# Patient Record
Sex: Male | Born: 1965
Health system: Southern US, Community
[De-identification: ages and names within clinical notes are randomized; demographics above are authoritative.]

## PROBLEM LIST (undated history)

## (undated) DIAGNOSIS — Z803 Family history of malignant neoplasm of breast: Secondary | ICD-10-CM

## (undated) DIAGNOSIS — E66811 Obesity, class 1: Secondary | ICD-10-CM

## (undated) DIAGNOSIS — Z9889 Other specified postprocedural states: Secondary | ICD-10-CM

## (undated) DIAGNOSIS — S8992XA Unspecified injury of left lower leg, initial encounter: Secondary | ICD-10-CM

## (undated) DIAGNOSIS — G629 Polyneuropathy, unspecified: Secondary | ICD-10-CM

## (undated) DIAGNOSIS — K219 Gastro-esophageal reflux disease without esophagitis: Secondary | ICD-10-CM

## (undated) DIAGNOSIS — E785 Hyperlipidemia, unspecified: Secondary | ICD-10-CM

## (undated) DIAGNOSIS — C61 Malignant neoplasm of prostate: Secondary | ICD-10-CM

## (undated) DIAGNOSIS — A048 Other specified bacterial intestinal infections: Secondary | ICD-10-CM

## (undated) DIAGNOSIS — Z8042 Family history of malignant neoplasm of prostate: Secondary | ICD-10-CM

## (undated) DIAGNOSIS — Z8639 Personal history of other endocrine, nutritional and metabolic disease: Secondary | ICD-10-CM

## (undated) DIAGNOSIS — S069XAA Unspecified intracranial injury with loss of consciousness status unknown, initial encounter: Secondary | ICD-10-CM

## (undated) DIAGNOSIS — Z8669 Personal history of other diseases of the nervous system and sense organs: Secondary | ICD-10-CM

## (undated) DIAGNOSIS — Z1211 Encounter for screening for malignant neoplasm of colon: Secondary | ICD-10-CM

## (undated) DIAGNOSIS — R7303 Prediabetes: Secondary | ICD-10-CM

## (undated) DIAGNOSIS — E8881 Metabolic syndrome: Secondary | ICD-10-CM

## (undated) DIAGNOSIS — E669 Obesity, unspecified: Secondary | ICD-10-CM

## (undated) DIAGNOSIS — R0600 Dyspnea, unspecified: Secondary | ICD-10-CM

## (undated) DIAGNOSIS — R202 Paresthesia of skin: Secondary | ICD-10-CM

## (undated) DIAGNOSIS — R0789 Other chest pain: Secondary | ICD-10-CM

## (undated) DIAGNOSIS — M47816 Spondylosis without myelopathy or radiculopathy, lumbar region: Secondary | ICD-10-CM

## (undated) DIAGNOSIS — Z809 Family history of malignant neoplasm, unspecified: Secondary | ICD-10-CM

## (undated) DIAGNOSIS — G43909 Migraine, unspecified, not intractable, without status migrainosus: Secondary | ICD-10-CM

## (undated) DIAGNOSIS — S069X9A Unspecified intracranial injury with loss of consciousness of unspecified duration, initial encounter: Secondary | ICD-10-CM

## (undated) DIAGNOSIS — G4733 Obstructive sleep apnea (adult) (pediatric): Secondary | ICD-10-CM

## (undated) HISTORY — DX: Personal history of other endocrine, nutritional and metabolic disease: Z86.39

## (undated) HISTORY — DX: Obesity, unspecified: E66.9

## (undated) HISTORY — DX: Malignant neoplasm of prostate: C61

## (undated) HISTORY — DX: Family history of malignant neoplasm, unspecified: Z80.9

## (undated) HISTORY — DX: Unspecified intracranial injury with loss of consciousness status unknown, initial encounter: S06.9XAA

## (undated) HISTORY — DX: Other specified bacterial intestinal infections: A04.8

## (undated) HISTORY — DX: Hyperlipidemia, unspecified: E78.5

## (undated) HISTORY — DX: Polyneuropathy, unspecified: G62.9

## (undated) HISTORY — DX: Unspecified injury of left lower leg, initial encounter: S89.92XA

## (undated) HISTORY — DX: Family history of malignant neoplasm of breast: Z80.3

## (undated) HISTORY — DX: Spondylosis without myelopathy or radiculopathy, lumbar region: M47.816

## (undated) HISTORY — DX: Unspecified intracranial injury with loss of consciousness of unspecified duration, initial encounter: S06.9X9A

## (undated) HISTORY — DX: Migraine, unspecified, not intractable, without status migrainosus: G43.909

## (undated) HISTORY — DX: Metabolic syndrome: E88.81

## (undated) HISTORY — DX: Dyspnea, unspecified: R06.00

## (undated) HISTORY — DX: Family history of malignant neoplasm of prostate: Z80.42

## (undated) HISTORY — DX: Other specified postprocedural states: Z98.890

## (undated) HISTORY — DX: Obesity, class 1: E66.811

## (undated) HISTORY — DX: Obstructive sleep apnea (adult) (pediatric): G47.33

## (undated) HISTORY — DX: Other chest pain: R07.89

## (undated) HISTORY — DX: Personal history of other diseases of the nervous system and sense organs: Z86.69

## (undated) HISTORY — DX: Prediabetes: R73.03

## (undated) HISTORY — DX: Encounter for screening for malignant neoplasm of colon: Z12.11

## (undated) HISTORY — DX: Paresthesia of skin: R20.2

---

## 1995-11-06 HISTORY — PX: APPENDECTOMY: SHX54

## 2002-08-10 ENCOUNTER — Inpatient Hospital Stay (HOSPITAL_COMMUNITY): Admission: EM | Admit: 2002-08-10 | Discharge: 2002-08-11 | Payer: Self-pay | Admitting: Emergency Medicine

## 2002-08-10 ENCOUNTER — Encounter: Payer: Self-pay | Admitting: Emergency Medicine

## 2003-10-19 ENCOUNTER — Encounter: Admission: RE | Admit: 2003-10-19 | Discharge: 2003-10-19 | Payer: Self-pay | Admitting: Family Medicine

## 2006-11-05 HISTORY — PX: BICEPS TENDON REPAIR: SHX566

## 2009-11-05 DIAGNOSIS — E8881 Metabolic syndrome: Secondary | ICD-10-CM

## 2009-11-05 DIAGNOSIS — Z8639 Personal history of other endocrine, nutritional and metabolic disease: Secondary | ICD-10-CM

## 2009-11-05 HISTORY — DX: Metabolic syndrome: E88.81

## 2009-11-05 HISTORY — DX: Personal history of other endocrine, nutritional and metabolic disease: Z86.39

## 2009-11-05 HISTORY — DX: Metabolic syndrome: E88.810

## 2010-05-05 DIAGNOSIS — R0789 Other chest pain: Secondary | ICD-10-CM

## 2010-05-05 HISTORY — DX: Other chest pain: R07.89

## 2010-11-05 DIAGNOSIS — S8992XA Unspecified injury of left lower leg, initial encounter: Secondary | ICD-10-CM

## 2010-11-05 HISTORY — DX: Unspecified injury of left lower leg, initial encounter: S89.92XA

## 2011-03-06 DIAGNOSIS — Z8669 Personal history of other diseases of the nervous system and sense organs: Secondary | ICD-10-CM

## 2011-03-06 HISTORY — DX: Personal history of other diseases of the nervous system and sense organs: Z86.69

## 2011-11-06 DIAGNOSIS — R0600 Dyspnea, unspecified: Secondary | ICD-10-CM

## 2011-11-06 HISTORY — DX: Dyspnea, unspecified: R06.00

## 2011-11-26 DIAGNOSIS — E785 Hyperlipidemia, unspecified: Secondary | ICD-10-CM | POA: Insufficient documentation

## 2011-11-30 ENCOUNTER — Encounter (INDEPENDENT_AMBULATORY_CARE_PROVIDER_SITE_OTHER): Payer: Federal, State, Local not specified - PPO | Admitting: *Deleted

## 2011-11-30 ENCOUNTER — Encounter: Payer: Self-pay | Admitting: Internal Medicine

## 2011-11-30 ENCOUNTER — Ambulatory Visit (INDEPENDENT_AMBULATORY_CARE_PROVIDER_SITE_OTHER): Payer: Federal, State, Local not specified - PPO | Admitting: Internal Medicine

## 2011-11-30 ENCOUNTER — Telehealth: Payer: Self-pay | Admitting: *Deleted

## 2011-11-30 ENCOUNTER — Ambulatory Visit (INDEPENDENT_AMBULATORY_CARE_PROVIDER_SITE_OTHER)
Admission: RE | Admit: 2011-11-30 | Discharge: 2011-11-30 | Disposition: A | Discharge status: Home / Self Care | Payer: Federal, State, Local not specified - PPO | Source: Ambulatory Visit | Attending: Internal Medicine | Admitting: Internal Medicine

## 2011-11-30 VITALS — BP 122/88 | HR 81 | Temp 98.1°F | Ht 74.5 in | Wt 239.6 lb

## 2011-11-30 DIAGNOSIS — R0609 Other forms of dyspnea: Secondary | ICD-10-CM

## 2011-11-30 DIAGNOSIS — R0602 Shortness of breath: Secondary | ICD-10-CM

## 2011-11-30 DIAGNOSIS — R0600 Dyspnea, unspecified: Secondary | ICD-10-CM

## 2011-11-30 DIAGNOSIS — M79609 Pain in unspecified limb: Secondary | ICD-10-CM

## 2011-11-30 DIAGNOSIS — I2699 Other pulmonary embolism without acute cor pulmonale: Secondary | ICD-10-CM

## 2011-11-30 DIAGNOSIS — M79606 Pain in leg, unspecified: Secondary | ICD-10-CM

## 2011-11-30 DIAGNOSIS — R0989 Other specified symptoms and signs involving the circulatory and respiratory systems: Secondary | ICD-10-CM

## 2011-11-30 MED ORDER — IOHEXOL 300 MG/ML  SOLN
80.0000 mL | Freq: Once | INTRAMUSCULAR | Status: AC | PRN
  Administered 2011-11-30: 180 mL via INTRAVENOUS
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
## 2011-11-30 NOTE — Telephone Encounter (Signed)
Received call from [facility] at [facility] stating pt had CT chest done which indicated no PE.  Showed results to MR who stated ok to inform pt no PE but that we will be calling him to set up further testing.  Rose verbalized understanding and stated she would relay message to pt who will be waiting a call from our office regarding appt date/times for further test.  Per MR:  Order PFTS ASAP at either [facility] or [facility]  Also order: venous duplex leg bilat to r/o DVT.  Pt will need an appt with MR next week, approx Wednesday?.    Orders are in the computer.  Will forward message on to Victorino Dike so she can schedule pt a f/u appt  next week as MR's schedule is full.

## 2011-11-30 NOTE — Patient Instructions (Signed)
Have ct angio chest today to rule out pulmonary embolism  - result to be paged to DR [facility] today If that is fine, then we will do pft breathing test

## 2011-12-02 DIAGNOSIS — R0600 Dyspnea, unspecified: Secondary | ICD-10-CM | POA: Insufficient documentation
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
## 2011-12-03 ENCOUNTER — Ambulatory Visit (HOSPITAL_COMMUNITY)
Admission: RE | Admit: 2011-12-03 | Discharge: 2011-12-03 | Disposition: A | Discharge status: Home / Self Care | Payer: Federal, State, Local not specified - PPO | Source: Ambulatory Visit | Attending: Internal Medicine | Admitting: Internal Medicine

## 2011-12-03 DIAGNOSIS — R0989 Other specified symptoms and signs involving the circulatory and respiratory systems: Secondary | ICD-10-CM | POA: Insufficient documentation

## 2011-12-03 DIAGNOSIS — R0609 Other forms of dyspnea: Secondary | ICD-10-CM | POA: Insufficient documentation

## 2011-12-03 DIAGNOSIS — R0600 Dyspnea, unspecified: Secondary | ICD-10-CM

## 2011-12-03 LAB — PULMONARY FUNCTION TEST

## 2011-12-03 NOTE — Telephone Encounter (Signed)
PER MR advise the pt that duplex was negative and set f/u. Pt aware and appt set for 12-06-11. Carron Curie, CMA

## 2011-12-04 ENCOUNTER — Telehealth: Payer: Self-pay | Admitting: Internal Medicine

## 2011-12-04 NOTE — Telephone Encounter (Signed)
I spoke with [name] and she stated she did not try to call Jacob James. I spoke with Jacob James and made him aware of this and nothing further was needed

## 2011-12-06 ENCOUNTER — Encounter: Payer: Self-pay | Admitting: Internal Medicine

## 2011-12-06 ENCOUNTER — Ambulatory Visit (INDEPENDENT_AMBULATORY_CARE_PROVIDER_SITE_OTHER): Payer: Federal, State, Local not specified - PPO | Admitting: Internal Medicine

## 2011-12-06 VITALS — BP 112/76 | HR 71 | Temp 98.0°F | Ht 74.5 in | Wt 239.6 lb

## 2011-12-06 DIAGNOSIS — R0609 Other forms of dyspnea: Secondary | ICD-10-CM

## 2011-12-06 DIAGNOSIS — R0602 Shortness of breath: Secondary | ICD-10-CM

## 2011-12-06 DIAGNOSIS — R0989 Other specified symptoms and signs involving the circulatory and respiratory systems: Secondary | ICD-10-CM

## 2011-12-06 DIAGNOSIS — R0600 Dyspnea, unspecified: Secondary | ICD-10-CM
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
## 2011-12-06 NOTE — Patient Instructions (Signed)
Please have methacholine challenge test for asthma  - once done have technician fax it to our office and call and tell our office to have me review test  -  i will get back to you with result Depending on result, come in or need cpst bike stress test We will [facility] to [facility] [place], [place]

## 2011-12-07 ENCOUNTER — Encounter: Payer: Self-pay | Admitting: Family Medicine

## 2011-12-07 ENCOUNTER — Telehealth: Payer: Self-pay | Admitting: Family Medicine

## 2011-12-07 ENCOUNTER — Ambulatory Visit (INDEPENDENT_AMBULATORY_CARE_PROVIDER_SITE_OTHER): Payer: Federal, State, Local not specified - PPO | Admitting: Family Medicine

## 2011-12-07 DIAGNOSIS — R1110 Vomiting, unspecified: Secondary | ICD-10-CM

## 2011-12-07 DIAGNOSIS — R0609 Other forms of dyspnea: Secondary | ICD-10-CM

## 2011-12-07 DIAGNOSIS — R0600 Dyspnea, unspecified: Secondary | ICD-10-CM

## 2011-12-07 DIAGNOSIS — K5900 Constipation, unspecified: Secondary | ICD-10-CM | POA: Insufficient documentation

## 2011-12-07 DIAGNOSIS — G4700 Insomnia, unspecified: Secondary | ICD-10-CM

## 2011-12-07 DIAGNOSIS — R350 Frequency of micturition: Secondary | ICD-10-CM

## 2011-12-07 DIAGNOSIS — R3911 Hesitancy of micturition: Secondary | ICD-10-CM | POA: Insufficient documentation

## 2011-12-07 DIAGNOSIS — R0989 Other specified symptoms and signs involving the circulatory and respiratory systems: Secondary | ICD-10-CM

## 2011-12-07 LAB — COMPREHENSIVE METABOLIC PANEL
AST: 16 U/L (ref 0–37)
Albumin: 4.3 g/dL (ref 3.5–5.2)
Alkaline Phosphatase: 51 U/L (ref 39–117)
BUN: 15 mg/dL (ref 6–23)
Glucose, Bld: 123 mg/dL — ABNORMAL HIGH (ref 70–99)
Potassium: 4.2 mEq/L (ref 3.5–5.1)
Sodium: 135 mEq/L (ref 135–145)
Total Bilirubin: 0.7 mg/dL (ref 0.3–1.2)

## 2011-12-07 LAB — URINALYSIS, ROUTINE W REFLEX MICROSCOPIC
Bilirubin Urine: NEGATIVE
Hgb urine dipstick: NEGATIVE
Ketones, ur: NEGATIVE
Specific Gravity, Urine: 1.025 (ref 1.000–1.030)
Urine Glucose: NEGATIVE
Urobilinogen, UA: 0.2 (ref 0.0–1.0)

## 2011-12-07 LAB — TSH: TSH: 1.15 u[IU]/mL (ref 0.35–5.50)

## 2011-12-07 LAB — CBC WITH DIFFERENTIAL/PLATELET
Basophils Relative: 0.2 % (ref 0.0–3.0)
Eosinophils Absolute: 0.1 10*3/uL (ref 0.0–0.7)
HCT: 44.2 % (ref 39.0–52.0)
Lymphs Abs: 1.5 10*3/uL (ref 0.7–4.0)
MCHC: 34.2 g/dL (ref 30.0–36.0)
MCV: 84.4 fl (ref 78.0–100.0)
Monocytes Absolute: 0.6 10*3/uL (ref 0.1–1.0)
Neutrophils Relative %: 78.5 % — ABNORMAL HIGH (ref 43.0–77.0)
Platelets: 285 10*3/uL (ref 150.0–400.0)

## 2011-12-07 LAB — POCT URINALYSIS DIPSTICK
Ketones, UA: NEGATIVE
Protein, UA: NEGATIVE
Spec Grav, UA: 1.025
Urobilinogen, UA: 0.2

## 2011-12-07 LAB — H. PYLORI ANTIBODY, IGG: H Pylori IgG: POSITIVE

## 2011-12-07 MED ORDER — TAMSULOSIN HCL 0.4 MG PO CAPS: 0.4000 mg | ORAL_CAPSULE | Freq: Every day | ORAL | Status: DC

## 2011-12-07 MED ORDER — CLONAZEPAM 1 MG PO TABS: ORAL_TABLET | ORAL | Status: DC

## 2011-12-07 NOTE — Assessment & Plan Note (Addendum)
Currently resolved. Unclear etiology.  So far w/u with pulmonologist has been unrevealing. Pt states his next test is the methacholine bronchoprovocation testing.

## 2011-12-07 NOTE — Assessment & Plan Note (Signed)
Acute BPH obstructive sx's vs irritative/compressive symptom from a stool-filled colon. If catharsis with miralax over the next few days does not improve his urinary sx's, then I advised him to fill the rx for tamsulosin 0.4mg  qhs. I feel like prostatitis is much less likely since his DRE today was pretty normal and his urinary sx's did not improve any when he took a full 7 d course of levaquin for dx of pneumonia. Will send urine sample for c/s for completeness.

## 2011-12-07 NOTE — Telephone Encounter (Signed)
Pls request records from [facility] in [place], [place]--thx.

## 2011-12-07 NOTE — Telephone Encounter (Signed)
Faxed 12/07/11

## 2011-12-07 NOTE — Patient Instructions (Signed)
Miralax (OTC) 1/2-1 capful 1-2 times daily for constipation

## 2011-12-07 NOTE — Assessment & Plan Note (Signed)
Unclear etiology, but clearly he is exhausted and needs sleep aid short term. I rx'd clonazepam 1mg .  Therapeutic expectations and side effect profile of medication discussed today.  Patient's questions answered.

## 2011-12-07 NOTE — Telephone Encounter (Signed)
Pls request records from [facility] in [place] as well as [facility] ([place])-thx.
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
## 2011-12-07 NOTE — Assessment & Plan Note (Signed)
Miralax 1/2-1 capful qd-bid (OTC) recommended.

## 2011-12-09 ENCOUNTER — Encounter: Payer: Self-pay | Admitting: Internal Medicine

## 2011-12-09 LAB — URINE CULTURE: Colony Count: 3000

## 2011-12-09 NOTE — Assessment & Plan Note (Signed)
Though acute dyspnea has resolved there is still this issue of why he was dyspneic and no explanation for his sensitivity to perfumes and sense of chest tightness during exposure. Will get methacholine challenge test and if negative will do CPST

## 2011-12-10 ENCOUNTER — Encounter: Payer: Self-pay | Admitting: Family Medicine

## 2011-12-10 NOTE — Telephone Encounter (Signed)
Faxed request 12/10/11

## 2011-12-13 ENCOUNTER — Other Ambulatory Visit: Payer: Self-pay | Admitting: Family Medicine

## 2011-12-13 ENCOUNTER — Encounter: Payer: Self-pay | Admitting: Family Medicine

## 2011-12-13 ENCOUNTER — Ambulatory Visit (INDEPENDENT_AMBULATORY_CARE_PROVIDER_SITE_OTHER): Payer: Federal, State, Local not specified - PPO | Admitting: Family Medicine

## 2011-12-13 DIAGNOSIS — R0609 Other forms of dyspnea: Secondary | ICD-10-CM

## 2011-12-13 DIAGNOSIS — K5900 Constipation, unspecified: Secondary | ICD-10-CM

## 2011-12-13 DIAGNOSIS — E785 Hyperlipidemia, unspecified: Secondary | ICD-10-CM

## 2011-12-13 DIAGNOSIS — R0600 Dyspnea, unspecified: Secondary | ICD-10-CM

## 2011-12-13 DIAGNOSIS — G4700 Insomnia, unspecified: Secondary | ICD-10-CM

## 2011-12-13 LAB — LIPID PANEL
Cholesterol: 171 mg/dL (ref 0–200)
LDL Cholesterol: 97 mg/dL (ref 0–99)
Triglycerides: 176 mg/dL — ABNORMAL HIGH (ref 0.0–149.0)

## 2011-12-13 MED ORDER — ATORVASTATIN CALCIUM 40 MG PO TABS: 40.0000 mg | ORAL_TABLET | Freq: Every day | ORAL | Status: DC

## 2011-12-13 NOTE — Progress Notes (Signed)
Addended by: Andrew Au on: 12/13/2011 12:04 PM   Modules accepted: Orders
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
## 2011-12-14 ENCOUNTER — Ambulatory Visit (HOSPITAL_COMMUNITY)
Admission: RE | Admit: 2011-12-14 | Discharge: 2011-12-14 | Disposition: A | Discharge status: Home / Self Care | Payer: Federal, State, Local not specified - PPO | Source: Ambulatory Visit | Attending: Internal Medicine | Admitting: Internal Medicine

## 2011-12-14 DIAGNOSIS — R0602 Shortness of breath: Secondary | ICD-10-CM

## 2011-12-14 DIAGNOSIS — R0989 Other specified symptoms and signs involving the circulatory and respiratory systems: Secondary | ICD-10-CM | POA: Insufficient documentation

## 2011-12-14 DIAGNOSIS — R0609 Other forms of dyspnea: Secondary | ICD-10-CM | POA: Insufficient documentation

## 2011-12-14 LAB — PULMONARY FUNCTION TEST

## 2011-12-14 MED ORDER — METHACHOLINE 1 MG/ML NEB SOLN
2.0000 mL | Freq: Once | RESPIRATORY_TRACT | Status: AC
  Administered 2011-12-14: 2 mg via RESPIRATORY_TRACT

## 2011-12-14 MED ORDER — METHACHOLINE 0.0625 MG/ML NEB SOLN
2.0000 mL | Freq: Once | RESPIRATORY_TRACT | Status: AC
  Administered 2011-12-14: 0.125 mg via RESPIRATORY_TRACT

## 2011-12-14 MED ORDER — ALBUTEROL SULFATE (5 MG/ML) 0.5% IN NEBU
2.5000 mg | INHALATION_SOLUTION | Freq: Once | RESPIRATORY_TRACT | Status: AC
  Administered 2011-12-14: 2.5 mg via RESPIRATORY_TRACT

## 2011-12-14 MED ORDER — METHACHOLINE 16 MG/ML NEB SOLN: 2.0000 mL | Freq: Once | RESPIRATORY_TRACT | Status: DC

## 2011-12-14 MED ORDER — METHACHOLINE 4 MG/ML NEB SOLN
2.0000 mL | Freq: Once | RESPIRATORY_TRACT | Status: AC
  Administered 2011-12-14: 8 mg via RESPIRATORY_TRACT

## 2011-12-14 MED ORDER — SODIUM CHLORIDE 0.9 % IN NEBU
3.0000 mL | INHALATION_SOLUTION | Freq: Once | RESPIRATORY_TRACT | Status: AC
  Administered 2011-12-14: 3 mL via RESPIRATORY_TRACT

## 2011-12-14 MED ORDER — METHACHOLINE 0.25 MG/ML NEB SOLN
2.0000 mL | Freq: Once | RESPIRATORY_TRACT | Status: AC
  Administered 2011-12-14: 0.5 mg via RESPIRATORY_TRACT

## 2011-12-28 ENCOUNTER — Telehealth: Payer: Self-pay | Admitting: Internal Medicine

## 2011-12-28 DIAGNOSIS — R0600 Dyspnea, unspecified: Secondary | ICD-10-CM

## 2011-12-28 NOTE — Telephone Encounter (Addendum)
Order placed. [id number] to advise the pt. Carron Curie, CMA

## 2011-12-28 NOTE — Telephone Encounter (Signed)
Jacob James  Methacholine challenge test 12/14/11 is negative.  Please set up CPST bike test for chest tightness and dyspnea. Needs EIB challenge as well. Order done; pls double check it is accurate. FU after CPST  Thanks MR

## 2011-12-31 NOTE — Telephone Encounter (Signed)
Spoke with the pt and he is aware of results. CPST set for 01-14-12 but pt states he will be out of town that day so I called and r/s appt to 01-22-12 at 11:30. Pt ok with this appt. Carron Curie, CMA

## 2012-01-14 ENCOUNTER — Ambulatory Visit (HOSPITAL_COMMUNITY): Payer: Federal, State, Local not specified - PPO

## 2012-01-22 ENCOUNTER — Ambulatory Visit (HOSPITAL_COMMUNITY): Payer: Federal, State, Local not specified - PPO | Attending: Internal Medicine

## 2012-01-29 ENCOUNTER — Ambulatory Visit (INDEPENDENT_AMBULATORY_CARE_PROVIDER_SITE_OTHER): Payer: Federal, State, Local not specified - PPO | Admitting: Family Medicine

## 2012-01-29 ENCOUNTER — Encounter: Payer: Self-pay | Admitting: Family Medicine

## 2012-01-29 VITALS — BP 122/83 | HR 66 | Temp 97.6°F | Ht 74.5 in | Wt 243.8 lb

## 2012-01-29 DIAGNOSIS — F419 Anxiety disorder, unspecified: Secondary | ICD-10-CM

## 2012-01-29 DIAGNOSIS — A048 Other specified bacterial intestinal infections: Secondary | ICD-10-CM

## 2012-01-29 DIAGNOSIS — G43909 Migraine, unspecified, not intractable, without status migrainosus: Secondary | ICD-10-CM

## 2012-01-29 DIAGNOSIS — J3089 Other allergic rhinitis: Secondary | ICD-10-CM

## 2012-01-29 DIAGNOSIS — T7840XA Allergy, unspecified, initial encounter: Secondary | ICD-10-CM

## 2012-01-29 DIAGNOSIS — J329 Chronic sinusitis, unspecified: Secondary | ICD-10-CM

## 2012-01-29 DIAGNOSIS — F411 Generalized anxiety disorder: Secondary | ICD-10-CM

## 2012-01-29 DIAGNOSIS — J3081 Allergic rhinitis due to animal (cat) (dog) hair and dander: Secondary | ICD-10-CM

## 2012-01-29 DIAGNOSIS — R112 Nausea with vomiting, unspecified: Secondary | ICD-10-CM

## 2012-01-29 MED ORDER — CLONAZEPAM 1 MG PO TABS: ORAL_TABLET | ORAL | Status: DC

## 2012-01-29 MED ORDER — SUMATRIPTAN SUCCINATE 100 MG PO TABS: 100.0000 mg | ORAL_TABLET | ORAL | Status: AC | PRN

## 2012-01-29 MED ORDER — CETIRIZINE HCL 10 MG PO TABS: 10.0000 mg | ORAL_TABLET | Freq: Every day | ORAL | Status: AC | PRN

## 2012-01-29 MED ORDER — AMOXICILLIN 500 MG PO CAPS: ORAL_CAPSULE | ORAL | Status: DC

## 2012-01-29 MED ORDER — CLARITHROMYCIN 500 MG PO TABS: 500.0000 mg | ORAL_TABLET | Freq: Two times a day (BID) | ORAL | Status: DC

## 2012-01-29 MED ORDER — AMOXICILLIN-POT CLAVULANATE 875-125 MG PO TABS: 1.0000 | ORAL_TABLET | Freq: Two times a day (BID) | ORAL | Status: DC

## 2012-01-29 MED ORDER — OMEPRAZOLE 20 MG PO CPDR: DELAYED_RELEASE_CAPSULE | ORAL | Status: DC

## 2012-01-29 MED ORDER — PROMETHAZINE HCL 25 MG PO TABS: 25.0000 mg | ORAL_TABLET | Freq: Three times a day (TID) | ORAL | Status: DC | PRN

## 2012-01-29 MED ORDER — ALIGN PO CAPS: 1.0000 | ORAL_CAPSULE | Freq: Every day | ORAL | Status: DC

## 2012-01-29 MED ORDER — KETOROLAC TROMETHAMINE 60 MG/2ML IM SOLN
15.0000 mg | Freq: Once | INTRAMUSCULAR | Status: AC
  Administered 2012-01-29: 15 mg via INTRAMUSCULAR

## 2012-01-29 NOTE — Patient Instructions (Addendum)
Helicobacter Pylori and Ulcer Disease An ulcer may be in your stomach (gastric ulcer) or in the first part of your small bowel, which is called the duodenum (duodenal ulcer). An ulcer is a break in the stomach or duodenum lining. The break wears down into the deeper tissue. Helicobacter pylori (H. pylori) is a type of germ (bacteria) that may cause the majority of gastric or duodenal ulcers. CAUSES   A germ (bacterium). H. pylori can weaken the protective mucous coating of the stomach and duodenum. This allows acid to get through to the sensitive lining of the stomach or duodenum and an ulcer can then form.   Certain medications.   Using substances that can bother the lining of the stomach (alcohol, tobacco or medications such as Advil or Motrin) in the presence of H.pylori infection. This can increase the chances of getting an ulcer.   Cancer (rarely).  Most people infected with H. pylori do not get ulcers. It is not known how people catch H. pylori. It may be through food or water. H. pylori has been found in the saliva of some infected people. Therefore, the bacteria may also spread through mouth-to-mouth contact such as kissing. SYMPTOMS  The problems (symptoms) of ulcer disease are usually:  A burning or gnawing of the mid-upper belly (abdomen). This is often worse on an empty stomach. It may get better with food. This may be associated with feeling sick to your stomach (nausea), bloating and vomiting.   If the ulcer results in bleeding, it can cause:   Black, tarry stools.   Throwing up bright red blood.   Throwing up coffee ground looking materials.  With severe bleeding, there may be loss of consciousness and shock. Besides ulcer disease, H. pylori can also cause chronic gastritis (irritation of the lining of the stomach without ulcer) or stomach acid-type discomfort. You may not have symptoms even though you have an H. pylori infection. Although this is an infection, you may not  have usual infection symptoms (such as fever). DIAGNOSIS  Ulcer disease can be diagnosed in many different ways. If you have an ulcer, it is important to know whether or not it is caused by H. Pylori. Treatment for an ulcer caused by H. pylori is different from that for an ulcer with other causes. The best way to detect H. pylori is taking tissue directly from the ulcer during an endoscopy test.   An endoscopy is an exam that uses an endoscope. This is a thin, lighted tube with a small camera on the end. It is like a flexible telescope. The patient is given a drug to make them calm (sedative). The caregiver eases the endoscope into the mouth and down the throat to the stomach and duodenum. This allows the doctor to see the lining of the esophagus, stomach and duodenum.   If an endoscopy is not needed, then H. pylori can be detected with tests of the blood, stool or even breath.  TREATMENT   H. pylori peptic ulcer treatment usually involves a combination of:   Medicines that kill germs (antibiotics).   Acid suppressors.   Stomach protectors.   The use of only one medication to treat H. pylori is not recommended. The most proven treatment is a 2 week course of treatment called triple therapy. It involves taking two antibiotics to kill the bacteria and either an acid suppressor or stomach-lining shield. Two-week triple therapy reduces ulcer symptoms, kills the bacteria, and prevents ulcers from coming back in many   patients.   Unfortunately, patients may find triple therapy hard to do. This is because it involves taking as many as 20 pills a day. Also, the antibiotics used in triple therapy may cause mild side effects. These include nausea, vomiting, diarrhea, dark stools, a metallic taste in the mouth, dizziness, headache and yeast infections in women. Talk to your caregiver if you have any of these side effects.  HOME CARE INSTRUCTIONS   Take your medications as directed and for as long as  prescribed. Contact your caregiver if you have problems or side effects from your medications.   Continue regular work and usual activities unless told otherwise by your caregiver.   Avoid tobacco, alcohol and caffeine. Tobacco use will decrease and slow healing.   Avoid medications that are harmful. This includes aspirin and NSAIDS such as ibuprofen and naproxen.   Avoid foods that seem to aggravate or cause discomfort.   There are many over-the-counter products available to control stomach acid and other symptoms. Discuss these with your caregiver before using them. Do not  stop taking prescription medications for over-the-counter medications without talking with your caregiver.   Special diets are not usually needed.   Keep any follow-up appointments and blood tests as directed.  SEEK MEDICAL CARE IF:   Your pain or other ulcer symptoms do not improve within a few days of starting treatment.   You develop diarrhea. This can be a problem related to certain treatments.   You have ongoing indigestion or heartburn even if your main ulcer symptoms are improved.   You think you have any side effects from your medications or if you do not understand how to use your medications right.  SEEK IMMEDIATE MEDICAL CARE IF:  Any of the following happen:  You develop bright red, rectal bleeding.   You develop dark black, tarry stools.   You throw up (vomit) blood.   You become light-headed, weak, have fainting episodes, or become sweaty, cold and clammy.   You have severe abdominal pain not controlled by medications. Do not take pain medications unless ordered by your caregiver.  MAKE SURE YOU:   Understand these instructions.   Will watch your condition.   Will get help right away if you are not doing well or get worse.  Document Released: 01/12/2004 Document Revised: 10/11/2011 Document Reviewed: 06/10/2008 [facility] Patient Information 2012 [place], [place].    After 10 days of  Amoxicillin and Biaxin then start the Augmentin to finish treating the sinusitis Start a probiotic, Align or another daily, eat a yogurt daily  Promethazine to use prn nausea vomiting, will help you sleep  Increase your fluid intake use some Gatorade and Ginger Ale to helps stay hydrated, Ginger ale helps nausea some

## 2012-01-30 ENCOUNTER — Encounter: Payer: Self-pay | Admitting: Family Medicine

## 2012-01-30 DIAGNOSIS — G43909 Migraine, unspecified, not intractable, without status migrainosus: Secondary | ICD-10-CM | POA: Insufficient documentation

## 2012-01-30 DIAGNOSIS — J329 Chronic sinusitis, unspecified: Secondary | ICD-10-CM | POA: Insufficient documentation

## 2012-01-30 DIAGNOSIS — A048 Other specified bacterial intestinal infections: Secondary | ICD-10-CM

## 2012-01-30 HISTORY — DX: Other specified bacterial intestinal infections: A04.8

## 2012-01-30 NOTE — Assessment & Plan Note (Addendum)
More frequent and intense migraines since her started struggling with sinusitis. Given a refill on Imitrex which he has taken in the past. Given a shot of Toradol in office and prescription for Promethazine to use prn n/v. Needs to try and increase fluid intake

## 2012-01-30 NOTE — Assessment & Plan Note (Signed)
Has been struggling with symptoms off and on since December. Will have him start Augmentin after finishing his course of Amoxicillin

## 2012-01-30 NOTE — Assessment & Plan Note (Signed)
Biaxin, Amoxicillin and Omeprazole are prescribed today. He is encouraged to take all of them bid for 10 days.
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
## 2012-02-08 ENCOUNTER — Other Ambulatory Visit: Payer: Self-pay | Admitting: Family Medicine

## 2012-02-08 DIAGNOSIS — J329 Chronic sinusitis, unspecified: Secondary | ICD-10-CM

## 2012-02-08 MED ORDER — AMOXICILLIN-POT CLAVULANATE 875-125 MG PO TABS: 1.0000 | ORAL_TABLET | Freq: Two times a day (BID) | ORAL | Status: AC

## 2012-02-08 NOTE — Telephone Encounter (Signed)
amoxicillin 125 MG patient lost Rx in move, also needs lipitor sent to [facility] in [facility] (by Lannette Donath)  Pt was given augmentin on 3/26 and told to hold until he finished amoxicillin 500 and Biaxin.   Lipitor last filled on 12/13/11 with 6 refills.  Pt can call pharmacy for refill.

## 2012-02-11 ENCOUNTER — Encounter: Payer: Self-pay | Admitting: Family Medicine

## 2012-02-11 DIAGNOSIS — E8881 Metabolic syndrome: Secondary | ICD-10-CM | POA: Insufficient documentation

## 2012-02-11 DIAGNOSIS — Z125 Encounter for screening for malignant neoplasm of prostate: Secondary | ICD-10-CM | POA: Insufficient documentation

## 2012-03-07 ENCOUNTER — Other Ambulatory Visit: Payer: Self-pay

## 2012-03-07 DIAGNOSIS — A048 Other specified bacterial intestinal infections: Secondary | ICD-10-CM

## 2012-03-07 MED ORDER — OMEPRAZOLE 20 MG PO CPDR: DELAYED_RELEASE_CAPSULE | ORAL | Status: DC

## 2012-03-07 MED ORDER — ATORVASTATIN CALCIUM 40 MG PO TABS: 40.0000 mg | ORAL_TABLET | Freq: Every day | ORAL | Status: DC

## 2012-04-03 ENCOUNTER — Encounter: Payer: Self-pay | Admitting: Family Medicine

## 2012-04-03 ENCOUNTER — Telehealth: Payer: Self-pay | Admitting: Family Medicine

## 2012-04-03 ENCOUNTER — Ambulatory Visit (INDEPENDENT_AMBULATORY_CARE_PROVIDER_SITE_OTHER): Payer: Federal, State, Local not specified - PPO | Admitting: Family Medicine

## 2012-04-03 ENCOUNTER — Ambulatory Visit (HOSPITAL_BASED_OUTPATIENT_CLINIC_OR_DEPARTMENT_OTHER)
Admission: RE | Admit: 2012-04-03 | Discharge: 2012-04-03 | Disposition: A | Discharge status: Home / Self Care | Payer: Federal, State, Local not specified - PPO | Source: Ambulatory Visit | Attending: Family Medicine | Admitting: Family Medicine

## 2012-04-03 VITALS — BP 123/76 | HR 102 | Temp 102.2°F | Ht 74.5 in | Wt 251.0 lb

## 2012-04-03 DIAGNOSIS — M47812 Spondylosis without myelopathy or radiculopathy, cervical region: Secondary | ICD-10-CM | POA: Insufficient documentation

## 2012-04-03 DIAGNOSIS — R070 Pain in throat: Secondary | ICD-10-CM | POA: Insufficient documentation

## 2012-04-03 DIAGNOSIS — A250 Spirillosis: Secondary | ICD-10-CM

## 2012-04-03 DIAGNOSIS — J36 Peritonsillar abscess: Secondary | ICD-10-CM

## 2012-04-03 DIAGNOSIS — R509 Fever, unspecified: Secondary | ICD-10-CM | POA: Insufficient documentation

## 2012-04-03 DIAGNOSIS — J029 Acute pharyngitis, unspecified: Secondary | ICD-10-CM

## 2012-04-03 DIAGNOSIS — R599 Enlarged lymph nodes, unspecified: Secondary | ICD-10-CM | POA: Insufficient documentation

## 2012-04-03 MED ORDER — CEFTRIAXONE SODIUM 1 G IJ SOLR
1.0000 g | Freq: Once | INTRAMUSCULAR | Status: AC
  Administered 2012-04-03: 1 g via INTRAMUSCULAR

## 2012-04-03 MED ORDER — IOHEXOL 300 MG/ML  SOLN
75.0000 mL | Freq: Once | INTRAMUSCULAR | Status: AC | PRN
  Administered 2012-04-03: 75 mL via INTRAVENOUS

## 2012-04-03 MED ORDER — CEFDINIR 300 MG PO CAPS: 300.0000 mg | ORAL_CAPSULE | Freq: Two times a day (BID) | ORAL | Status: AC

## 2012-04-03 MED ORDER — OXYCODONE-ACETAMINOPHEN 5-500 MG PO CAPS: ORAL_CAPSULE | ORAL | Status: DC

## 2012-04-03 NOTE — Telephone Encounter (Signed)
Discussed results of CT neck with pt's wife.  He has asymmetric peritonsillar fullness c/w phlegmon-inflammitory but NOT abscess. Essentially, he has severe asymmetric tonsillitis and we'll continue with the plan as outlined in my office note from today. If he is unable to swallow pain med or antipyretic med then I recommended he go to the E.D so that he can get IM pain med and further e/m. I will have Diane call him tomorrow morning to set up a f/u visit tomorrow morning in the office.--PM

## 2012-04-03 NOTE — Assessment & Plan Note (Signed)
Severity of illness + unilateral nature of his pain makes it imperiative that we r/o retropharyngeal or peritonsillar abscess. Rocephin 1g IM given in office today. He is really unable to swallow right now so we were not able to get any tylenol in him. We've set up a stat CT neck soft tissue w/contrast at [facility] and he has called his wife to drive him there. As long as no abscess is found, I've called omnicef 300mg  bid for him to take. I have discussed the option of going to med center [facility] ED if he feels like he's simply not going to be able to get any pain pills down.  I did give rx for tylox 5/500, 1-2 q6h prn, #30, no RF.
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
## 2012-04-04 ENCOUNTER — Ambulatory Visit (INDEPENDENT_AMBULATORY_CARE_PROVIDER_SITE_OTHER): Payer: Federal, State, Local not specified - PPO | Admitting: Family Medicine

## 2012-04-04 ENCOUNTER — Encounter: Payer: Self-pay | Admitting: Family Medicine

## 2012-04-04 VITALS — BP 117/71 | HR 87 | Temp 98.0°F | Ht 74.5 in | Wt 247.0 lb

## 2012-04-04 DIAGNOSIS — R7309 Other abnormal glucose: Secondary | ICD-10-CM

## 2012-04-04 DIAGNOSIS — R739 Hyperglycemia, unspecified: Secondary | ICD-10-CM

## 2012-04-04 DIAGNOSIS — E8881 Metabolic syndrome: Secondary | ICD-10-CM

## 2012-04-04 DIAGNOSIS — J029 Acute pharyngitis, unspecified: Secondary | ICD-10-CM

## 2012-04-04 LAB — GLUCOSE, POCT (MANUAL RESULT ENTRY): POC Glucose: 121 mg/dl — AB (ref 70–99)
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
## 2012-04-04 NOTE — Assessment & Plan Note (Signed)
MUCH IMPROVED overnight. Continue with plan of omnicef, percocet prn, push fluids. Discussed CT results with him and he did say he has noted for "years" that the left side in his throat has looked larger than right. He has no hx of smoking.  We talked about the possible need to recheck a CT scan when his sx's have resolved to completely r/o any malignant process in this area.

## 2012-04-04 NOTE — Assessment & Plan Note (Signed)
Review of past PMD's labs 2011 show HDL <40, trigs >240, and he has abdominal obesity. A nonfasting gluc Feb 2013 and nonfasting gluc here today of 121 are reassuring that no new onset diabetes is involved with his current illness. However, I stressed the importance of returning in 16mo for CPE and complete FASTING labs.

## 2012-05-02 ENCOUNTER — Ambulatory Visit (INDEPENDENT_AMBULATORY_CARE_PROVIDER_SITE_OTHER): Payer: Federal, State, Local not specified - PPO | Admitting: Family Medicine

## 2012-05-02 ENCOUNTER — Encounter: Payer: Self-pay | Admitting: Family Medicine

## 2012-05-02 VITALS — BP 127/89 | HR 80 | Ht 74.5 in | Wt 245.0 lb

## 2012-05-02 DIAGNOSIS — Z0000 Encounter for general adult medical examination without abnormal findings: Secondary | ICD-10-CM

## 2012-05-02 DIAGNOSIS — F411 Generalized anxiety disorder: Secondary | ICD-10-CM

## 2012-05-02 DIAGNOSIS — J029 Acute pharyngitis, unspecified: Secondary | ICD-10-CM | POA: Insufficient documentation

## 2012-05-02 DIAGNOSIS — F419 Anxiety disorder, unspecified: Secondary | ICD-10-CM

## 2012-05-02 DIAGNOSIS — J069 Acute upper respiratory infection, unspecified: Secondary | ICD-10-CM

## 2012-05-02 LAB — CBC WITH DIFFERENTIAL/PLATELET
Eosinophils Relative: 1.9 % (ref 0.0–5.0)
HCT: 43.1 % (ref 39.0–52.0)
Lymphocytes Relative: 27.3 % (ref 12.0–46.0)
Lymphs Abs: 2.3 10*3/uL (ref 0.7–4.0)
Monocytes Relative: 9.6 % (ref 3.0–12.0)
Neutrophils Relative %: 61 % (ref 43.0–77.0)
Platelets: 252 10*3/uL (ref 150.0–400.0)
WBC: 8.4 10*3/uL (ref 4.5–10.5)

## 2012-05-02 LAB — TSH: TSH: 1.33 u[IU]/mL (ref 0.35–5.50)

## 2012-05-02 LAB — COMPREHENSIVE METABOLIC PANEL
ALT: 32 U/L (ref 0–53)
Albumin: 4.3 g/dL (ref 3.5–5.2)
CO2: 30 mEq/L (ref 19–32)
Calcium: 9.5 mg/dL (ref 8.4–10.5)
Chloride: 99 mEq/L (ref 96–112)
GFR: 77.62 mL/min (ref >60.00)
Potassium: 4.2 mEq/L (ref 3.5–5.1)
Sodium: 139 mEq/L (ref 135–145)
Total Protein: 7.9 g/dL (ref 6.0–8.3)

## 2012-05-02 LAB — LIPID PANEL: Total CHOL/HDL Ratio: 5

## 2012-05-02 MED ORDER — CLONAZEPAM 1 MG PO TABS: ORAL_TABLET | ORAL | Status: DC

## 2012-05-02 NOTE — Assessment & Plan Note (Signed)
Reviewed age and gender appropriate health maintenance issues (prudent diet, regular exercise, health risks of tobacco and excessive alcohol, use of seatbelts, fire alarms in home, use of sunscreen).  Also reviewed age and gender appropriate health screening as well as vaccine recommendations. Discussed lifestyle modifications he needs to make a priority.  Discussed the fact that he has metabolic syndrome and what this means. Recheck labs (fasting) today. Will also recheck a neck soft tissue CT with contrast to make sure there are no persisting abnormalities now that his symptoms/illness has abated.
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
## 2012-05-05 ENCOUNTER — Ambulatory Visit (HOSPITAL_BASED_OUTPATIENT_CLINIC_OR_DEPARTMENT_OTHER)
Admission: RE | Admit: 2012-05-05 | Discharge: 2012-05-05 | Disposition: A | Discharge status: Home / Self Care | Payer: Federal, State, Local not specified - PPO | Source: Ambulatory Visit | Attending: Family Medicine | Admitting: Family Medicine

## 2012-05-05 DIAGNOSIS — J069 Acute upper respiratory infection, unspecified: Secondary | ICD-10-CM | POA: Insufficient documentation

## 2012-05-05 DIAGNOSIS — M47812 Spondylosis without myelopathy or radiculopathy, cervical region: Secondary | ICD-10-CM | POA: Insufficient documentation

## 2012-05-05 DIAGNOSIS — J029 Acute pharyngitis, unspecified: Secondary | ICD-10-CM

## 2012-05-05 MED ORDER — IOHEXOL 300 MG/ML  SOLN
80.0000 mL | Freq: Once | INTRAMUSCULAR | Status: AC | PRN
  Administered 2012-05-05: 80 mL via INTRAVENOUS

## 2013-03-13 ENCOUNTER — Ambulatory Visit: Payer: Federal, State, Local not specified - PPO | Admitting: Family Medicine

## 2013-03-25 ENCOUNTER — Other Ambulatory Visit: Payer: Self-pay | Admitting: Family Medicine

## 2013-03-25 ENCOUNTER — Ambulatory Visit (INDEPENDENT_AMBULATORY_CARE_PROVIDER_SITE_OTHER): Payer: Federal, State, Local not specified - PPO | Admitting: Family Medicine

## 2013-03-25 ENCOUNTER — Encounter: Payer: Self-pay | Admitting: Family Medicine

## 2013-03-25 VITALS — BP 130/83 | HR 98 | Temp 98.1°F | Resp 18 | Wt 254.0 lb

## 2013-03-25 DIAGNOSIS — J209 Acute bronchitis, unspecified: Secondary | ICD-10-CM

## 2013-03-25 MED ORDER — HYDROCODONE-HOMATROPINE 5-1.5 MG/5ML PO SYRP: ORAL_SOLUTION | ORAL | Status: DC

## 2013-03-25 NOTE — Telephone Encounter (Signed)
Rx request to pharmacy/[facility]  

## 2013-03-25 NOTE — Patient Instructions (Addendum)
Take mucinex DM extra strength--OTC every morning and evening. Take your hydrocodone cough syrup as needed in evenings. Take 2 puffs of symbicort twice a day--rinse mouth out after each dosing of this. Use OTC nasal saline spray to irrigate/moisturize nasal passages --several times per day.
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
## 2013-03-26 ENCOUNTER — Other Ambulatory Visit: Payer: Federal, State, Local not specified - PPO

## 2013-04-13 ENCOUNTER — Telehealth: Payer: Self-pay | Admitting: Family Medicine

## 2013-04-13 MED ORDER — PREDNISONE 20 MG PO TABS: 40.0000 mg | ORAL_TABLET | Freq: Every day | ORAL | Status: DC

## 2013-04-13 MED ORDER — DOXYCYCLINE HYCLATE 100 MG PO TABS: 100.0000 mg | ORAL_TABLET | Freq: Two times a day (BID) | ORAL | Status: DC

## 2013-04-13 NOTE — Assessment & Plan Note (Signed)
Take mucinex DM extra strength--OTC every morning and evening. Take your hydrocodone cough syrup as needed in evenings. Take 2 puffs of symbicort twice a day--rinse mouth out after each dosing of this. Use OTC nasal saline spray to irrigate/moisturize nasal passages --several times per day. 

## 2013-04-13 NOTE — Telephone Encounter (Signed)
Rx sent to pharmacy/[facility] 

## 2013-04-13 NOTE — Telephone Encounter (Signed)
Patient is agreeable with filling the 2 Rx today and coming in for an OV if no improvement.

## 2013-04-13 NOTE — Telephone Encounter (Signed)
Nasal congestion: Yes, yellow in color Post nasal drip: No Tightness in chest/SOB: Just a little Wheezing: Slight in upper Heartburn/refluz: No Please advise/[facility]

## 2013-04-13 NOTE — Telephone Encounter (Signed)
Patient is still experiencing a persistant cough (had OV 03/25/13). He states it feels like he has something in his throat. What does the doctor recommend?

## 2013-04-13 NOTE — Telephone Encounter (Signed)
OK to call in prednisone 20mg , 2 tabs po qd x 5d, #10, no RF. Also, doxycycline 100mg , 1 tab po bid x 10d, #20, no RF. Stop mucinex DM.  Continue nasal saline irrigation/spray.   If worsening over the next few days then come back in OR if not improved any in 10d then return. --thx

## 2013-04-13 NOTE — Telephone Encounter (Signed)
Any nasal congestion or post-nasal drip still? Any feeling of tightness in chest or wheeze when he breaths? Any feeling of heartburn/reflux?

## 2013-04-20 ENCOUNTER — Encounter: Payer: Self-pay | Admitting: Family Medicine

## 2013-04-20 ENCOUNTER — Ambulatory Visit (INDEPENDENT_AMBULATORY_CARE_PROVIDER_SITE_OTHER): Payer: Federal, State, Local not specified - PPO | Admitting: Family Medicine

## 2013-04-20 VITALS — BP 144/97 | HR 72 | Temp 99.1°F | Resp 18 | Ht 74.0 in | Wt 256.0 lb

## 2013-04-20 DIAGNOSIS — R030 Elevated blood-pressure reading, without diagnosis of hypertension: Secondary | ICD-10-CM

## 2013-04-20 DIAGNOSIS — J209 Acute bronchitis, unspecified: Secondary | ICD-10-CM

## 2013-04-20 DIAGNOSIS — K219 Gastro-esophageal reflux disease without esophagitis: Secondary | ICD-10-CM

## 2013-04-20 MED ORDER — HYDROCODONE-HOMATROPINE 5-1.5 MG/5ML PO SYRP: ORAL_SOLUTION | ORAL | Status: DC

## 2013-04-20 MED ORDER — AZITHROMYCIN 250 MG PO TABS: ORAL_TABLET | ORAL | Status: DC

## 2013-04-20 MED ORDER — PANTOPRAZOLE SODIUM 40 MG PO TBEC: 40.0000 mg | DELAYED_RELEASE_TABLET | Freq: Every day | ORAL | Status: DC

## 2013-04-20 MED ORDER — ALBUTEROL SULFATE HFA 108 (90 BASE) MCG/ACT IN AERS: 2.0000 | INHALATION_SPRAY | RESPIRATORY_TRACT | Status: DC | PRN
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
## 2013-05-05 DIAGNOSIS — Z9889 Other specified postprocedural states: Secondary | ICD-10-CM

## 2013-05-05 HISTORY — DX: Other specified postprocedural states: Z98.890

## 2013-05-13 ENCOUNTER — Encounter: Payer: Self-pay | Admitting: Family Medicine

## 2013-05-13 ENCOUNTER — Ambulatory Visit (INDEPENDENT_AMBULATORY_CARE_PROVIDER_SITE_OTHER): Payer: Federal, State, Local not specified - PPO | Admitting: Family Medicine

## 2013-05-13 VITALS — BP 127/86 | HR 92 | Temp 99.0°F | Resp 16 | Ht 74.0 in | Wt 248.0 lb

## 2013-05-13 DIAGNOSIS — R221 Localized swelling, mass and lump, neck: Secondary | ICD-10-CM

## 2013-05-13 DIAGNOSIS — R112 Nausea with vomiting, unspecified: Secondary | ICD-10-CM

## 2013-05-13 DIAGNOSIS — J029 Acute pharyngitis, unspecified: Secondary | ICD-10-CM

## 2013-05-13 DIAGNOSIS — R220 Localized swelling, mass and lump, head: Secondary | ICD-10-CM

## 2013-05-13 MED ORDER — ATORVASTATIN CALCIUM 40 MG PO TABS: ORAL_TABLET | ORAL | Status: DC

## 2013-05-13 MED ORDER — PROMETHAZINE HCL 25 MG PO TABS: ORAL_TABLET | ORAL | Status: DC

## 2013-05-13 MED ORDER — AMOXICILLIN-POT CLAVULANATE 875-125 MG PO TABS: 1.0000 | ORAL_TABLET | Freq: Two times a day (BID) | ORAL | Status: DC
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
## 2013-05-14 ENCOUNTER — Telehealth: Payer: Self-pay | Admitting: Family Medicine

## 2013-05-14 ENCOUNTER — Ambulatory Visit (HOSPITAL_BASED_OUTPATIENT_CLINIC_OR_DEPARTMENT_OTHER)
Admission: RE | Admit: 2013-05-14 | Discharge: 2013-05-14 | Disposition: A | Discharge status: Home / Self Care | Payer: Federal, State, Local not specified - PPO | Source: Ambulatory Visit | Attending: Family Medicine | Admitting: Family Medicine

## 2013-05-14 DIAGNOSIS — R220 Localized swelling, mass and lump, head: Secondary | ICD-10-CM | POA: Insufficient documentation

## 2013-05-14 DIAGNOSIS — R509 Fever, unspecified: Secondary | ICD-10-CM | POA: Insufficient documentation

## 2013-05-14 DIAGNOSIS — R599 Enlarged lymph nodes, unspecified: Secondary | ICD-10-CM | POA: Insufficient documentation

## 2013-05-14 DIAGNOSIS — R221 Localized swelling, mass and lump, neck: Secondary | ICD-10-CM

## 2013-05-14 DIAGNOSIS — M542 Cervicalgia: Secondary | ICD-10-CM | POA: Insufficient documentation

## 2013-05-14 DIAGNOSIS — J351 Hypertrophy of tonsils: Secondary | ICD-10-CM | POA: Insufficient documentation

## 2013-05-14 MED ORDER — IOHEXOL 300 MG/ML  SOLN
75.0000 mL | Freq: Once | INTRAMUSCULAR | Status: AC | PRN
  Administered 2013-05-14: 75 mL via INTRAVENOUS

## 2013-05-14 NOTE — Telephone Encounter (Signed)
Pt called stating that his neck is about twice as swollen as it was yesterday when he was in office.  Patient did start antibiotics yesterday after visit.  Please advise.

## 2013-05-14 NOTE — Telephone Encounter (Signed)
CT neck to be done stat at [facility], r/o peritonsillar/retropharyngeal abscess.

## 2013-05-14 NOTE — Telephone Encounter (Signed)
Spoke with Dr. Pollyann Kennedy, then ENT MD on call, about this case.  He looked at pt's CT scan and recommended that he continue on augmentin and call his office in the morning ([facility]) and they'll get him in for visit at that time and if he needs  I & D they can do it in their office.  I relayed this info to pt and he expressed understanding and agreed with plan.  Since tylenol is not helping his pain anymore, I'll call in vicodin to his pharmacy.

## 2013-05-15 ENCOUNTER — Encounter (HOSPITAL_COMMUNITY): Payer: Self-pay | Admitting: *Deleted

## 2013-05-15 ENCOUNTER — Encounter (HOSPITAL_COMMUNITY): Payer: Self-pay | Admitting: Pharmacy Technician

## 2013-05-16 ENCOUNTER — Encounter (HOSPITAL_COMMUNITY): Payer: Self-pay | Admitting: *Deleted

## 2013-05-16 ENCOUNTER — Ambulatory Visit (HOSPITAL_COMMUNITY): Payer: Federal, State, Local not specified - PPO | Admitting: Anesthesiology

## 2013-05-16 ENCOUNTER — Encounter (HOSPITAL_COMMUNITY): Payer: Self-pay | Admitting: Anesthesiology

## 2013-05-16 ENCOUNTER — Encounter (HOSPITAL_COMMUNITY)
Admission: RE | Disposition: A | Discharge status: Home / Self Care | Payer: Self-pay | Source: Ambulatory Visit | Attending: Otolaryngology

## 2013-05-16 ENCOUNTER — Observation Stay (HOSPITAL_COMMUNITY)
Admission: RE | Admit: 2013-05-16 | Discharge: 2013-05-17 | Disposition: A | Discharge status: Home / Self Care | Payer: Federal, State, Local not specified - PPO | Source: Ambulatory Visit | Attending: Otolaryngology | Admitting: Otolaryngology

## 2013-05-16 DIAGNOSIS — J029 Acute pharyngitis, unspecified: Secondary | ICD-10-CM

## 2013-05-16 DIAGNOSIS — J36 Peritonsillar abscess: Principal | ICD-10-CM | POA: Insufficient documentation

## 2013-05-16 HISTORY — DX: Gastro-esophageal reflux disease without esophagitis: K21.9

## 2013-05-16 HISTORY — PX: INCISION AND DRAINAGE OF PERITONSILLAR ABCESS: SHX6257

## 2013-05-16 LAB — CBC
HCT: 41.7 % (ref 39.0–52.0)
MCV: 80.8 fL (ref 78.0–100.0)
RBC: 5.16 MIL/uL (ref 4.22–5.81)
RDW: 13.6 % (ref 11.5–15.5)
WBC: 8.6 10*3/uL (ref 4.0–10.5)

## 2013-05-16 SURGERY — INCISION AND DRAINAGE, ABSCESS, PERITONSILLAR
Anesthesia: General | Site: Throat | Laterality: Right | Wound class: Dirty or Infected

## 2013-05-16 MED ORDER — ONDANSETRON HCL 4 MG/2ML IJ SOLN: 4.0000 mg | INTRAMUSCULAR | Status: DC | PRN

## 2013-05-16 MED ORDER — OXYCODONE HCL 5 MG/5ML PO SOLN: 5.0000 mg | Freq: Once | ORAL | Status: DC | PRN

## 2013-05-16 MED ORDER — LACTATED RINGERS IV SOLN
INTRAVENOUS | Status: DC | PRN
  Administered 2013-05-16: 10:00:00 via INTRAVENOUS

## 2013-05-16 MED ORDER — FENTANYL CITRATE 0.05 MG/ML IJ SOLN: 50.0000 ug | Freq: Once | INTRAMUSCULAR | Status: DC

## 2013-05-16 MED ORDER — GLYCOPYRROLATE 0.2 MG/ML IJ SOLN
INTRAMUSCULAR | Status: DC | PRN
  Administered 2013-05-16: .8 mg via INTRAVENOUS

## 2013-05-16 MED ORDER — ATORVASTATIN CALCIUM 40 MG PO TABS
40.0000 mg | ORAL_TABLET | Freq: Every day | ORAL | Status: DC
  Filled 2013-05-16: qty 1

## 2013-05-16 MED ORDER — SODIUM CHLORIDE 0.9 % IR SOLN
Status: DC | PRN
  Administered 2013-05-16: 1000 mL

## 2013-05-16 MED ORDER — OXYMETAZOLINE HCL 0.05 % NA SOLN
NASAL | Status: AC
  Filled 2013-05-16: qty 15

## 2013-05-16 MED ORDER — HYDROMORPHONE HCL PF 1 MG/ML IJ SOLN
0.2500 mg | INTRAMUSCULAR | Status: DC | PRN
  Administered 2013-05-16 (×4): 0.5 mg via INTRAVENOUS

## 2013-05-16 MED ORDER — PROPOFOL 10 MG/ML IV BOLUS
INTRAVENOUS | Status: DC | PRN
  Administered 2013-05-16: 200 mg via INTRAVENOUS

## 2013-05-16 MED ORDER — LIDOCAINE HCL (CARDIAC) 20 MG/ML IV SOLN
INTRAVENOUS | Status: DC | PRN
  Administered 2013-05-16: 100 mg via INTRAVENOUS

## 2013-05-16 MED ORDER — PROMETHAZINE HCL 25 MG/ML IJ SOLN
INTRAMUSCULAR | Status: AC
  Administered 2013-05-16: 18:00:00
  Filled 2013-05-16: qty 1

## 2013-05-16 MED ORDER — DEXTROSE-NACL 5-0.45 % IV SOLN
INTRAVENOUS | Status: DC
  Administered 2013-05-16 – 2013-05-17 (×3): via INTRAVENOUS

## 2013-05-16 MED ORDER — ONDANSETRON HCL 4 MG/2ML IJ SOLN
INTRAMUSCULAR | Status: DC | PRN
  Administered 2013-05-16: 4 mg via INTRAVENOUS

## 2013-05-16 MED ORDER — FENTANYL CITRATE 0.05 MG/ML IJ SOLN
INTRAMUSCULAR | Status: DC | PRN
  Administered 2013-05-16 (×3): 50 ug via INTRAVENOUS
  Administered 2013-05-16: 100 ug via INTRAVENOUS

## 2013-05-16 MED ORDER — ONDANSETRON HCL 4 MG PO TABS: 4.0000 mg | ORAL_TABLET | ORAL | Status: DC | PRN

## 2013-05-16 MED ORDER — HYDROMORPHONE HCL PF 1 MG/ML IJ SOLN
INTRAMUSCULAR | Status: AC
  Administered 2013-05-16: 13:00:00
  Filled 2013-05-16: qty 1

## 2013-05-16 MED ORDER — MIDAZOLAM HCL 5 MG/5ML IJ SOLN
INTRAMUSCULAR | Status: DC | PRN
  Administered 2013-05-16: 2 mg via INTRAVENOUS

## 2013-05-16 MED ORDER — PANTOPRAZOLE SODIUM 40 MG PO TBEC
40.0000 mg | DELAYED_RELEASE_TABLET | Freq: Every day | ORAL | Status: DC
  Administered 2013-05-16: 40 mg via ORAL
  Filled 2013-05-16: qty 1

## 2013-05-16 MED ORDER — OXYMETAZOLINE HCL 0.05 % NA SOLN
NASAL | Status: DC | PRN
  Administered 2013-05-16: 1 via NASAL

## 2013-05-16 MED ORDER — ADULT MULTIVITAMIN W/MINERALS CH
1.0000 | ORAL_TABLET | Freq: Every day | ORAL | Status: DC
  Filled 2013-05-16: qty 1

## 2013-05-16 MED ORDER — ROCURONIUM BROMIDE 100 MG/10ML IV SOLN
INTRAVENOUS | Status: DC | PRN
  Administered 2013-05-16: 50 mg via INTRAVENOUS

## 2013-05-16 MED ORDER — PIPERACILLIN-TAZOBACTAM 3.375 G IVPB
3.3750 g | Freq: Three times a day (TID) | INTRAVENOUS | Status: DC
  Administered 2013-05-16 – 2013-05-17 (×3): 3.375 g via INTRAVENOUS
  Filled 2013-05-16 (×5): qty 50

## 2013-05-16 MED ORDER — NEOSTIGMINE METHYLSULFATE 1 MG/ML IJ SOLN
INTRAMUSCULAR | Status: DC | PRN
  Administered 2013-05-16: 5 mg via INTRAVENOUS

## 2013-05-16 MED ORDER — PROMETHAZINE HCL 25 MG/ML IJ SOLN
6.2500 mg | INTRAMUSCULAR | Status: DC | PRN
  Administered 2013-05-16: 12.5 mg via INTRAVENOUS

## 2013-05-16 MED ORDER — MIDAZOLAM HCL 2 MG/2ML IJ SOLN: 1.0000 mg | INTRAMUSCULAR | Status: DC | PRN

## 2013-05-16 MED ORDER — HYDROCODONE-ACETAMINOPHEN 7.5-325 MG PO TABS
2.0000 | ORAL_TABLET | Freq: Four times a day (QID) | ORAL | Status: DC | PRN
Start: 2013-05-16 — End: 2013-05-17
  Administered 2013-05-16 – 2013-05-17 (×3): 2 via ORAL
  Filled 2013-05-16 (×3): qty 2

## 2013-05-16 MED ORDER — CLONAZEPAM 1 MG PO TABS: 1.0000 mg | ORAL_TABLET | Freq: Every evening | ORAL | Status: DC | PRN

## 2013-05-16 MED ORDER — OXYCODONE HCL 5 MG PO TABS: 5.0000 mg | ORAL_TABLET | Freq: Once | ORAL | Status: DC | PRN

## 2013-05-16 SURGICAL SUPPLY — 31 items
CANISTER SUCTION 2500CC (MISCELLANEOUS) ×2 IMPLANT
CATH ROBINSON RED A/P 10FR (CATHETERS) IMPLANT
CLEANER TIP ELECTROSURG 2X2 (MISCELLANEOUS) ×2 IMPLANT
CLOTH BEACON ORANGE TIMEOUT ST (SAFETY) ×2 IMPLANT
COAGULATOR SUCT SWTCH 10FR 6 (ELECTROSURGICAL) ×2 IMPLANT
CRADLE DONUT ADULT HEAD (MISCELLANEOUS) IMPLANT
ELECT COATED BLADE 2.86 ST (ELECTRODE) ×2 IMPLANT
ELECT REM PT RETURN 9FT ADLT (ELECTROSURGICAL)
ELECT REM PT RETURN 9FT PED (ELECTROSURGICAL)
ELECTRODE REM PT RETRN 9FT PED (ELECTROSURGICAL) IMPLANT
ELECTRODE REM PT RTRN 9FT ADLT (ELECTROSURGICAL) IMPLANT
GAUZE SPONGE 4X4 16PLY XRAY LF (GAUZE/BANDAGES/DRESSINGS) ×2 IMPLANT
GLOVE SS BIOGEL STRL SZ 7.5 (GLOVE) ×1 IMPLANT
GLOVE SUPERSENSE BIOGEL SZ 7.5 (GLOVE) ×1
GOWN STRL NON-REIN LRG LVL3 (GOWN DISPOSABLE) ×4 IMPLANT
KIT BASIN OR (CUSTOM PROCEDURE TRAY) ×2 IMPLANT
KIT ROOM TURNOVER OR (KITS) ×2 IMPLANT
NS IRRIG 1000ML POUR BTL (IV SOLUTION) ×2 IMPLANT
PACK SURGICAL SETUP 50X90 (CUSTOM PROCEDURE TRAY) ×2 IMPLANT
PAD ARMBOARD 7.5X6 YLW CONV (MISCELLANEOUS) ×4 IMPLANT
PENCIL FOOT CONTROL (ELECTRODE) ×2 IMPLANT
SPECIMEN JAR SMALL (MISCELLANEOUS) ×4 IMPLANT
SPONGE TONSIL 1 RF SGL (DISPOSABLE) ×2 IMPLANT
SYR BULB 3OZ (MISCELLANEOUS) ×2 IMPLANT
TOWEL OR 17X24 6PK STRL BLUE (TOWEL DISPOSABLE) ×4 IMPLANT
TUBE CONNECTING 12X1/4 (SUCTIONS) ×2 IMPLANT
TUBE SALEM SUMP 10F W/ARV (TUBING) IMPLANT
TUBE SALEM SUMP 12R W/ARV (TUBING) IMPLANT
TUBE SALEM SUMP 14F W/ARV (TUBING) IMPLANT
TUBE SALEM SUMP 16 FR W/ARV (TUBING) IMPLANT
WATER STERILE IRR 1000ML POUR (IV SOLUTION) ×2 IMPLANT
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
## 2013-05-16 NOTE — Preoperative (Signed)
Beta Blockers   Reason not to administer Beta Blockers:Not Applicable 

## 2013-05-16 NOTE — Anesthesia Preprocedure Evaluation (Addendum)
Anesthesia Evaluation  Patient identified by MRN, date of birth, ID band Patient awake    Reviewed: Allergy & Precautions, H&P , NPO status , Patient's Chart, lab work & pertinent test results  Airway Mallampati: III TM Distance: <3 FB Neck ROM: Limited  Mouth opening: Limited Mouth Opening  Dental  (+) Dental Advisory Given and Teeth Intact   Pulmonary shortness of breath, Recent URI ,  pharyngitis breath sounds clear to auscultation        Cardiovascular Rhythm:Regular Rate:Normal     Neuro/Psych  Headaches,    GI/Hepatic GERD-  ,  Endo/Other    Renal/GU      Musculoskeletal   Abdominal (+) + obese,   Peds  Hematology   Anesthesia Other Findings   Reproductive/Obstetrics                          Anesthesia Physical Anesthesia Plan  ASA: II  Anesthesia Plan: General   Post-op Pain Management:    Induction: Intravenous  Airway Management Planned: Oral ETT and Video Laryngoscope Planned  Additional Equipment:   Intra-op Plan:   Post-operative Plan: Extubation in OR  Informed Consent: I have reviewed the patients History and Physical, chart, labs and discussed the procedure including the risks, benefits and alternatives for the proposed anesthesia with the patient or authorized representative who has indicated his/her understanding and acceptance.     Plan Discussed with: CRNA and Surgeon  Anesthesia Plan Comments:         Anesthesia Quick Evaluation

## 2013-05-16 NOTE — Op Note (Signed)
Preop/postop diagnoses: Right peritonsillar abscess Procedure: Incision and drainage of right tonsil and biopsy Anesthesia: Gen. Estimated blood loss: Approximately 25 cc Indications: 47 year old who's had a swelling in the  right tonsil and sore throat for about one week. He has not responded to antibiotics. He had a CT scan performed at emergency room which showed a couple of pockets of hyperlucency that were consistent with a peritonsillar abscess but he did have a very enlarged tonsils itself with hypertrophy. We discussed an incision and drainage and he understands the issues with possibly not finding purulence. We discussed risks, benefits, and options and all his questions are answered and consent was obtained. Procedure: Patient was taken to the operating room placed in the supine position after general endotracheal tube anesthesia the Crowe-Davis mouth gag was inserted retracted and suspended from the Mayo stand. Anesthesia intubation head scratch the left anterior tonsillar pillar there was some bleeding which was already controlled. An incision was made in the right peritonsillar area and dissection was carried into the capsule of the tonsil. The tonsil was slightly firm. There was no obvious abscess that was identified immediately. Dissection was carried down inferiorly along the capsule of the tonsil and there was no purulence identified. There was some purulence expressed from the inferior aspect of the tonsil as the tonsil was squeezed. The small Yankauer suction was placed into the space and there was good drainage opening of the capsule is down inferiorly. The wound was irrigated. There was good hemostasis after a Afrin pledget placed into the pocket. Because the tonsil was firm a biopsy was taken from the tonsil. This tonsil biopsy area was cauterized. There was good hemostasis. The Crowe-Davis was released and resuspended and hemostasis present in all locations. The patient is an awake and  brought to cover stable condition counts correct

## 2013-05-16 NOTE — Transfer of Care (Signed)
Immediate Anesthesia Transfer of Care Note  Patient: Jacob James  Procedure(s) Performed: Procedure(s): INCISION AND DRAINAGE OF PERITONSILLAR ABCESS (Right)  Patient Location: PACU  Anesthesia Type:General  Level of Consciousness: awake, alert  and oriented  Airway & Oxygen Therapy: Patient Spontanous Breathing and Patient connected to nasal cannula oxygen  Post-op Assessment: Report given to PACU RN and Post -op Vital signs reviewed and stable  Post vital signs: Reviewed and stable  Complications: No apparent anesthesia complications

## 2013-05-16 NOTE — Anesthesia Postprocedure Evaluation (Signed)
  Anesthesia Post-op Note  Patient: Jacob James  Procedure(s) Performed: Procedure(s): INCISION AND DRAINAGE OF PERITONSILLAR ABCESS (Right)  Patient Location: PACU  Anesthesia Type:General  Level of Consciousness: awake  Airway and Oxygen Therapy: Patient Spontanous Breathing  Post-op Pain: mild  Post-op Assessment: Post-op Vital signs reviewed, Patient's Cardiovascular Status Stable, Respiratory Function Stable, Patent Airway, No signs of Nausea or vomiting and Pain level controlled  Post-op Vital Signs: stable  Complications: No apparent anesthesia complications

## 2013-05-17 NOTE — Progress Notes (Signed)
1 Day Post-Op  Subjective: Patient is doing very well. He feels much better since the incision and drainage. He is taking fluids well. He's afebrile. He is ready to go home.  Objective: Vital signs in last 24 hours: Temp:  [98 F (36.7 C)-98.8 F (37.1 C)] 98.4 F (36.9 C) (07/13 0557) Pulse Rate:  [58-93] 76 (07/13 0557) Resp:  [16-19] 18 (07/13 0557) BP: (112-147)/(67-97) 126/77 mmHg (07/13 0557) SpO2:  [93 %-100 %] 95 % (07/13 0557) Last BM Date: 05/16/13  Intake/Output from previous day: 07/12 0701 - 07/13 0700 In: 900 [I.V.:850; IV Piggyback:50] Out: 620 [Urine:600; Blood:20] Intake/Output this shift:    Awake and alert. He looks well and smiling. There is no bleeding. Swelling is approximately the same in the oral cavity oropharynx. Neck is about the same as well with still some very mild adenopathy. Lungs are clear. Heart regular.  Lab Results:   Recent Labs  05/16/13 0920  WBC 8.6  HGB 14.2  HCT 41.7  PLT 286   BMET No results found for this basename: NA, K, CL, CO2, GLUCOSE, BUN, CREATININE, CALCIUM,  in the last 72 hours PT/INR No results found for this basename: LABPROT, INR,  in the last 72 hours ABG No results found for this basename: PHART, PCO2, PO2, HCO3,  in the last 72 hours  Studies/Results: No results found.  Anti-infectives: Anti-infectives   Start     Dose/Rate Route Frequency Ordered Stop   05/16/13 1400  piperacillin-tazobactam (ZOSYN) IVPB 3.375 g     3.375 g 12.5 mL/hr over 240 Minutes Intravenous 3 times per day 05/16/13 1216        Assessment/Plan: s/p Procedure(s): INCISION AND DRAINAGE OF PERITONSILLAR ABCESS (Right) Discharge he will followup in 2 weeks. I will call him with the biopsy results early next week. He's to call or come back to the emergency room if anything worsens.  LOS: 1 day    Suzanna Obey 05/17/2013

## 2013-05-17 NOTE — Discharge Summary (Signed)
Physician Discharge Summary  Patient ID: Jacob James MRN: [id number] DOB/AGE: [birth year] 47 y.o.  Admit date: 05/16/2013 Discharge date: 05/17/2013  Admission Diagnoses: Right peritonsillar abscess  Discharge Diagnoses: Same Active Problems:   * No active hospital problems. *   Discharged Condition: good  Hospital Course: Patient admitted to the hospital for evaluation and treatment of right paratonsillar abscess. This was drained in the operating room. He had a small amount of purulence. A biopsy was also taken because of the enlarged tonsil and concern for neoplasm. He did well postoperatively and felt much better the following morning on Zosyn. He was taking fluids. His sore throat was much better. He was discharged to followup in 2 weeks.  Consults: None  Significant Diagnostic Studies: None  Treatments: surgery: Incision and drainage of right peritonsillar abscess and biopsy  Discharge Exam: Blood pressure 126/77, pulse 76, temperature 98.4 F (36.9 C), temperature source Oral, resp. rate 18, SpO2 95.00%. Is awake and alert. He looks where he well with no distress. Oral cavity/oropharynx-no significant change in the swelling. No bleeding. Neck is without significant swelling or erythema. Lungs are clear. Heart is regular. Abdomen soft. Extremities no tenderness.  Disposition:   Discharge Orders   Future Orders Complete By Expires     Call MD for:  difficulty breathing, headache or visual disturbances  As directed     Call MD for:  extreme fatigue  As directed     Call MD for:  hives  As directed     Call MD for:  persistant dizziness or light-headedness  As directed     Call MD for:  persistant nausea and vomiting  As directed     Call MD for:  redness, tenderness, or signs of infection (pain, swelling, redness, odor or green/yellow discharge around incision site)  As directed     Call MD for:  severe uncontrolled pain  As directed     Call MD for:  temperature >100.4   As directed     Diet - low sodium heart healthy  As directed     Discharge instructions  As directed     Comments:      Call if the sore throat is not almost completely resolved within 24-48 hours. Followup in the office for recheck of the wound in 2 weeks. Call if any new symptoms arise. Continue the Augmentin that was previously prescribed and call [phone] to confirm that this is the medication that you have.    Increase activity slowly  As directed         Medication List         amoxicillin-clavulanate 875-125 MG per tablet  Commonly known as:  AUGMENTIN  Take 1 tablet by mouth 2 (two) times daily.     atorvastatin 40 MG tablet  Commonly known as:  LIPITOR  Take 40 mg by mouth daily.     clonazePAM 1 MG tablet  Commonly known as:  KLONOPIN  Take 1 mg by mouth at bedtime as needed (sleep).     HYDROcodone-acetaminophen 7.5-325 MG per tablet  Commonly known as:  NORCO  Take 2 tablets by mouth every 6 (six) hours as needed for pain.     multivitamin with minerals Tabs  Take 1 tablet by mouth daily.     pantoprazole 40 MG tablet  Commonly known as:  PROTONIX  Take 1 tablet (40 mg total) by mouth daily.         SignedSuzanna Obey 05/17/2013, 9:00  AM

## 2013-05-19 ENCOUNTER — Encounter (HOSPITAL_COMMUNITY): Payer: Self-pay | Admitting: Otolaryngology

## 2013-05-20 ENCOUNTER — Encounter: Payer: Self-pay | Admitting: Family Medicine

## 2013-05-21 NOTE — Telephone Encounter (Signed)
Please advise.  I didn't see anything in chart.

## 2013-05-22 ENCOUNTER — Emergency Department (HOSPITAL_BASED_OUTPATIENT_CLINIC_OR_DEPARTMENT_OTHER)
Admission: EM | Admit: 2013-05-22 | Discharge: 2013-05-22 | Disposition: A | Discharge status: Home / Self Care | Payer: Federal, State, Local not specified - PPO | Attending: Emergency Medicine | Admitting: Emergency Medicine

## 2013-05-22 ENCOUNTER — Encounter (HOSPITAL_BASED_OUTPATIENT_CLINIC_OR_DEPARTMENT_OTHER): Payer: Self-pay | Admitting: *Deleted

## 2013-05-22 DIAGNOSIS — K219 Gastro-esophageal reflux disease without esophagitis: Secondary | ICD-10-CM | POA: Insufficient documentation

## 2013-05-22 DIAGNOSIS — Z8639 Personal history of other endocrine, nutritional and metabolic disease: Secondary | ICD-10-CM | POA: Insufficient documentation

## 2013-05-22 DIAGNOSIS — Z8679 Personal history of other diseases of the circulatory system: Secondary | ICD-10-CM | POA: Insufficient documentation

## 2013-05-22 DIAGNOSIS — R042 Hemoptysis: Secondary | ICD-10-CM | POA: Insufficient documentation

## 2013-05-22 DIAGNOSIS — Y838 Other surgical procedures as the cause of abnormal reaction of the patient, or of later complication, without mention of misadventure at the time of the procedure: Secondary | ICD-10-CM | POA: Insufficient documentation

## 2013-05-22 DIAGNOSIS — Z862 Personal history of diseases of the blood and blood-forming organs and certain disorders involving the immune mechanism: Secondary | ICD-10-CM | POA: Insufficient documentation

## 2013-05-22 DIAGNOSIS — IMO0002 Reserved for concepts with insufficient information to code with codable children: Secondary | ICD-10-CM | POA: Insufficient documentation

## 2013-05-22 DIAGNOSIS — Z79899 Other long term (current) drug therapy: Secondary | ICD-10-CM | POA: Insufficient documentation

## 2013-05-22 DIAGNOSIS — E669 Obesity, unspecified: Secondary | ICD-10-CM | POA: Insufficient documentation

## 2013-05-22 DIAGNOSIS — E785 Hyperlipidemia, unspecified: Secondary | ICD-10-CM | POA: Insufficient documentation

## 2013-05-22 DIAGNOSIS — J029 Acute pharyngitis, unspecified: Secondary | ICD-10-CM | POA: Insufficient documentation

## 2013-05-22 DIAGNOSIS — Z8619 Personal history of other infectious and parasitic diseases: Secondary | ICD-10-CM | POA: Insufficient documentation

## 2013-05-22 DIAGNOSIS — J358 Other chronic diseases of tonsils and adenoids: Secondary | ICD-10-CM

## 2013-05-22 DIAGNOSIS — Z8709 Personal history of other diseases of the respiratory system: Secondary | ICD-10-CM | POA: Insufficient documentation
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
## 2013-05-22 NOTE — ED Provider Notes (Signed)
Medical screening examination/treatment/procedure(s) were performed by non-physician practitioner and as supervising physician I was immediately available for consultation/collaboration.   Emet Rafanan, MD 05/22/13 2320 

## 2013-05-22 NOTE — ED Notes (Signed)
Post peritonilar abscess I&D bleeding. Spitting small amounts of bright red sputum mixed with blood at triage.

## 2013-06-04 DIAGNOSIS — R221 Localized swelling, mass and lump, neck: Secondary | ICD-10-CM | POA: Insufficient documentation

## 2013-06-04 DIAGNOSIS — J029 Acute pharyngitis, unspecified: Secondary | ICD-10-CM | POA: Insufficient documentation

## 2013-06-04 DIAGNOSIS — R112 Nausea with vomiting, unspecified: Secondary | ICD-10-CM | POA: Insufficient documentation

## 2013-09-10 ENCOUNTER — Other Ambulatory Visit: Payer: Self-pay

## 2013-11-25 ENCOUNTER — Ambulatory Visit (INDEPENDENT_AMBULATORY_CARE_PROVIDER_SITE_OTHER): Payer: Federal, State, Local not specified - PPO | Admitting: Family Medicine

## 2013-11-25 ENCOUNTER — Encounter: Payer: Self-pay | Admitting: Family Medicine

## 2013-11-25 VITALS — BP 133/80 | HR 98 | Temp 98.6°F | Resp 18 | Ht 74.0 in | Wt 246.0 lb

## 2013-11-25 DIAGNOSIS — J019 Acute sinusitis, unspecified: Secondary | ICD-10-CM

## 2013-11-25 MED ORDER — ATORVASTATIN CALCIUM 40 MG PO TABS: 40.0000 mg | ORAL_TABLET | Freq: Every day | ORAL | Status: DC

## 2013-11-25 MED ORDER — AZITHROMYCIN 250 MG PO TABS: 250.0000 mg | ORAL_TABLET | Freq: Every day | ORAL | Status: DC
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
## 2013-11-25 NOTE — Addendum Note (Signed)
Addended by: Tammi Sou on: 11/25/2013 09:27 AM   Modules accepted: Orders, Medications

## 2013-11-25 NOTE — Progress Notes (Signed)
Pre visit review using our clinic review tool, if applicable. No additional management support is needed unless otherwise documented below in the visit note. 

## 2014-02-03 HISTORY — PX: TONSILLECTOMY: SHX5217

## 2014-02-05 ENCOUNTER — Encounter (HOSPITAL_COMMUNITY): Payer: Self-pay | Admitting: Emergency Medicine

## 2014-02-05 ENCOUNTER — Emergency Department (HOSPITAL_COMMUNITY)
Admission: EM | Admit: 2014-02-05 | Discharge: 2014-02-05 | Disposition: A | Discharge status: Home / Self Care | Payer: Federal, State, Local not specified - PPO | Attending: Emergency Medicine | Admitting: Emergency Medicine

## 2014-02-05 DIAGNOSIS — R42 Dizziness and giddiness: Secondary | ICD-10-CM | POA: Insufficient documentation

## 2014-02-05 DIAGNOSIS — Z8669 Personal history of other diseases of the nervous system and sense organs: Secondary | ICD-10-CM | POA: Insufficient documentation

## 2014-02-05 DIAGNOSIS — Z6834 Body mass index (BMI) 34.0-34.9, adult: Secondary | ICD-10-CM | POA: Insufficient documentation

## 2014-02-05 DIAGNOSIS — E781 Pure hyperglyceridemia: Secondary | ICD-10-CM | POA: Insufficient documentation

## 2014-02-05 DIAGNOSIS — R220 Localized swelling, mass and lump, head: Secondary | ICD-10-CM | POA: Insufficient documentation

## 2014-02-05 DIAGNOSIS — Z8619 Personal history of other infectious and parasitic diseases: Secondary | ICD-10-CM | POA: Insufficient documentation

## 2014-02-05 DIAGNOSIS — E785 Hyperlipidemia, unspecified: Secondary | ICD-10-CM | POA: Insufficient documentation

## 2014-02-05 DIAGNOSIS — Z8679 Personal history of other diseases of the circulatory system: Secondary | ICD-10-CM | POA: Insufficient documentation

## 2014-02-05 DIAGNOSIS — R221 Localized swelling, mass and lump, neck: Secondary | ICD-10-CM

## 2014-02-05 DIAGNOSIS — Z8639 Personal history of other endocrine, nutritional and metabolic disease: Secondary | ICD-10-CM | POA: Insufficient documentation

## 2014-02-05 DIAGNOSIS — Z79899 Other long term (current) drug therapy: Secondary | ICD-10-CM | POA: Insufficient documentation

## 2014-02-05 DIAGNOSIS — Z8719 Personal history of other diseases of the digestive system: Secondary | ICD-10-CM | POA: Insufficient documentation

## 2014-02-05 DIAGNOSIS — E669 Obesity, unspecified: Secondary | ICD-10-CM | POA: Insufficient documentation

## 2014-02-05 DIAGNOSIS — M6740 Ganglion, unspecified site: Secondary | ICD-10-CM | POA: Insufficient documentation

## 2014-02-05 DIAGNOSIS — J029 Acute pharyngitis, unspecified: Secondary | ICD-10-CM

## 2014-02-05 LAB — RAPID STREP SCREEN (MED CTR MEBANE ONLY): STREPTOCOCCUS, GROUP A SCREEN (DIRECT): NEGATIVE

## 2014-02-05 MED ORDER — CLINDAMYCIN HCL 150 MG PO CAPS: 150.0000 mg | ORAL_CAPSULE | Freq: Four times a day (QID) | ORAL | Status: DC

## 2014-02-05 MED ORDER — OXYCODONE-ACETAMINOPHEN 5-325 MG PO TABS: 2.0000 | ORAL_TABLET | ORAL | Status: DC | PRN

## 2014-02-05 NOTE — ED Notes (Signed)
Pt with multiple complaints. Reports sore throat with painful swallowing x 4 days, decrease in oral intake. Report knot to right side of neck. Pt reports dizziness when going from laying to standing. Denies any dizziness at this time, ambulatory with no difficulty to room. Neuro intact.
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
## 2014-02-05 NOTE — ED Notes (Signed)
He states hes had a fever, painful swallowing and feels like there is a knot on his throat for past few days. He also has a painful knot on R knee he would like it to be looked at

## 2014-02-05 NOTE — Discharge Instructions (Signed)
Pharyngitis °Pharyngitis is redness, pain, and swelling (inflammation) of your pharynx.  °CAUSES  °Pharyngitis is usually caused by infection. Most of the time, these infections are from viruses (viral) and are part of a cold. However, sometimes pharyngitis is caused by bacteria (bacterial). Pharyngitis can also be caused by allergies. Viral pharyngitis may be spread from person to person by coughing, sneezing, and personal items or utensils (cups, forks, spoons, toothbrushes). Bacterial pharyngitis may be spread from person to person by more intimate contact, such as kissing.  °SIGNS AND SYMPTOMS  °Symptoms of pharyngitis include:   °· Sore throat.   °· Tiredness (fatigue).   °· Low-grade fever.   °· Headache. °· Joint pain and muscle aches. °· Skin rashes. °· Swollen lymph nodes. °· Plaque-like film on throat or tonsils (often seen with bacterial pharyngitis). °DIAGNOSIS  °Your health care provider will ask you questions about your illness and your symptoms. Your medical history, along with a physical exam, is often all that is needed to diagnose pharyngitis. Sometimes, a rapid strep test is done. Other lab tests may also be done, depending on the suspected cause.  °TREATMENT  °Viral pharyngitis will usually get better in 3 4 days without the use of medicine. Bacterial pharyngitis is treated with medicines that kill germs (antibiotics).  °HOME CARE INSTRUCTIONS  °· Drink enough water and fluids to keep your urine clear or pale yellow.   °· Only take over-the-counter or prescription medicines as directed by your health care provider:   °· If you are prescribed antibiotics, make sure you finish them even if you start to feel better.   °· Do not take aspirin.   °· Get lots of rest.   °· Gargle with 8 oz of salt water (½ tsp of salt per 1 qt of water) as often as every 1 2 hours to soothe your throat.   °· Throat lozenges (if you are not at risk for choking) or sprays may be used to soothe your throat. °SEEK MEDICAL  CARE IF:  °· You have large, tender lumps in your neck. °· You have a rash. °· You cough up green, yellow-brown, or bloody spit. °SEEK IMMEDIATE MEDICAL CARE IF:  °· Your neck becomes stiff. °· You drool or are unable to swallow liquids. °· You vomit or are unable to keep medicines or liquids down. °· You have severe pain that does not go away with the use of recommended medicines. °· You have trouble breathing (not caused by a stuffy nose). °MAKE SURE YOU:  °· Understand these instructions. °· Will watch your condition. °· Will get help right away if you are not doing well or get worse. °Document Released: 10/22/2005 Document Revised: 08/12/2013 Document Reviewed: 06/29/2013 °Exit[employer]® Patient Information ©2014 [employer]. ° °

## 2014-02-06 NOTE — ED Provider Notes (Signed)
Medical screening examination/treatment/procedure(s) were conducted as a shared visit with non-physician practitioner(s) and myself.  I personally evaluated the patient during the encounter.   EKG Interpretation None      I interviewed and examined the patient. Lungs are CTAB. Cardiac exam wnl. Abdomen soft.  Erythema of posterior oropharynx w/ white exudate. Swelling more prominent on the right than the left. No uvular deviation. No trismus. Normal rom of neck w/ mild to mod right anterior cervical lymphadenopathy. No issue handling secretions. Lower suspicion for PTA although pt has a hx of this. Offered CT vs symptomatic care and return for worsening.   Blanchard Kelch, MD 02/06/14 [phone]

## 2014-02-08 LAB — CULTURE, GROUP A STREP

## 2014-02-16 ENCOUNTER — Other Ambulatory Visit: Payer: Self-pay | Admitting: Otolaryngology

## 2014-03-01 ENCOUNTER — Ambulatory Visit (INDEPENDENT_AMBULATORY_CARE_PROVIDER_SITE_OTHER): Payer: Federal, State, Local not specified - PPO | Admitting: Family Medicine

## 2014-03-01 ENCOUNTER — Encounter: Payer: Self-pay | Admitting: Family Medicine

## 2014-03-01 VITALS — BP 116/78 | HR 80 | Temp 98.6°F | Resp 18 | Ht 74.0 in | Wt 245.0 lb

## 2014-03-01 DIAGNOSIS — M6740 Ganglion, unspecified site: Secondary | ICD-10-CM

## 2014-03-01 DIAGNOSIS — M6780 Other specified disorders of synovium and tendon, unspecified site: Secondary | ICD-10-CM

## 2014-03-01 NOTE — Progress Notes (Signed)
OFFICE NOTE  03/01/2014  CC:  Chief Complaint  Patient presents with  . Knee Pain    right knee, feels like a cyst in knee     HPI: Patient is a 48 y.o. Caucasian male who is here for right knee pain.   Onset of pain and swelling on lateral aspect of right knee about 1 mo ago or so. The focal swelling is increasing gradually.  No injury recalled.  No meds taken for this.  Bumping up against something hurts it.  No redness.  Pertinent PMH:  Past medical, surgical, social, and family history reviewed.  MEDS:  Lipitor 40mg  qd, omeprazole 20 qd  PE: Blood pressure 116/78, pulse 80, temperature 98.6 F (37 C), temperature source Temporal, resp. rate 18, height 6\' 2"  (1.88 m), weight 245 lb (111.131 kg), SpO2 96.00%. Gen: Alert, well appearing.  Patient is oriented to person, place, time, and situation. Right knee: inferolateral aspect with rubbery, mildly tender focal swelling about 2-3 cm in diameter, moveable--seems to be part of tendon sheath  IMPRESSION AND PLAN:  Tendon sheath cyst: will refer to Dr. Charlann Boxer in Sports Medicine for further eval, possible drainage of this if it is in fact a cyst. Pt expressed understanding of dx and agreed with plan.  An After Visit Summary was printed and given to the patient.  FOLLOW UP: prn

## 2014-03-01 NOTE — Progress Notes (Signed)
Pre visit review using our clinic review tool, if applicable. No additional management support is needed unless otherwise documented below in the visit note. 

## 2014-03-08 ENCOUNTER — Ambulatory Visit (INDEPENDENT_AMBULATORY_CARE_PROVIDER_SITE_OTHER): Payer: Federal, State, Local not specified - PPO | Admitting: Family Medicine

## 2014-03-08 ENCOUNTER — Encounter: Payer: Self-pay | Admitting: Family Medicine

## 2014-03-08 ENCOUNTER — Other Ambulatory Visit (INDEPENDENT_AMBULATORY_CARE_PROVIDER_SITE_OTHER): Payer: Federal, State, Local not specified - PPO

## 2014-03-08 VITALS — BP 138/82 | HR 81 | Wt 247.0 lb

## 2014-03-08 DIAGNOSIS — M25569 Pain in unspecified knee: Secondary | ICD-10-CM

## 2014-03-08 DIAGNOSIS — M6740 Ganglion, unspecified site: Secondary | ICD-10-CM | POA: Insufficient documentation

## 2014-03-08 DIAGNOSIS — M25561 Pain in right knee: Secondary | ICD-10-CM

## 2014-03-08 NOTE — Assessment & Plan Note (Signed)
Patient had a ganglion cyst aspiration and today. We decreased size by 60%. Discuss home care which includes icing, as well as a compression sleeve. Patient knows that 50% of these can reaccumulate. Patient will followup in 2 weeks for further evaluation. Hopefully another aspiration will not be necessary I would attempt if any reaccumulation has occurred.

## 2014-03-08 NOTE — Patient Instructions (Signed)
Good to meet you Ice 20 minutes 2 times a day Ganglion cyst was removed and gave you an injection today Come back in 2-3 weeks.

## 2014-03-08 NOTE — Progress Notes (Signed)
  Jacob James, Jacob James [address] Phone: [phone] Subjective:    I'm seeing this patient by the request  of:  MCGOWEN,PHILIP H, MD   CC: ankle swelling  [id number] Jacob James is a 48 y.o. male coming in with complaint of ankle swelling.      Past medical history, social, surgical and family history all reviewed in electronic medical record.   Review of Systems: No headache, visual changes, nausea, vomiting, diarrhea, constipation, dizziness, abdominal pain, skin rash, fevers, chills, night sweats, weight loss, swollen lymph nodes, body aches, joint swelling, muscle aches, chest pain, shortness of breath, mood changes.   Objective There were no vitals taken for this visit.  General: No apparent distress alert and oriented x3 mood and affect normal, dressed appropriately.  HEENT: Pupils equal, extraocular movements intact  Respiratory: Patient's speak in full sentences and does not appear short of breath  Cardiovascular: No lower extremity edema, non tender, no erythema  Skin: Warm dry intact with no signs of infection or rash on extremities or on axial skeleton.  Abdomen: Soft nontender  Neuro: Cranial nerves II through XII are intact, neurovascularly intact in all extremities with 2+ DTRs and 2+ pulses.  Lymph: No lymphadenopathy of posterior or anterior cervical chain or axillae bilaterally.  Gait normal with good balance and coordination.  MSK:  Non tender with full range of motion and good stability and symmetric strength and tone of shoulders, elbows, wrist, hip, and ankles bilaterally.   Knee: right On inspection patient does have what appears to be a cyst formation on the fibula head. This does have fluctuance but no signs of infection. Measures approximately the size of a golf ball. Palpation normal with no warmth, joint line tenderness, patellar tenderness, or condyle tenderness. ROM full in flexion and extension  and lower leg rotation. Ligaments with solid consistent endpoints including ACL, PCL, LCL, MCL. Negative Mcmurray's, Apley's, and Thessalonian tests. Non painful patellar compression. Patellar glide without crepitus. Patellar and quadriceps tendons unremarkable. Hamstring and quadriceps strength is normal.  Contralateral knee unremarkable. Patient's feet are in supination.  MSK US performed of: Right knee This study was ordered, performed, and interpreted by Charlann Boxer D.O.  Knee: All structures visualized. Anteromedial, anterolateral, posteromedial, and posterolateral menisci unremarkable without tearing, fraying, effusion, or displacement. Her patient's fibular head there appears to be a cystic structure with heterotropic material. This likely represents a ganglion cyst Patellar Tendon unremarkable on long and transverse views without effusion. No abnormality of prepatellar bursa. LCL and MCL unremarkable on long and transverse views. No abnormality of origin of medial or lateral head of the gastrocnemius.  IMPRESSION:  Fibular head ganglion cyst.  Procedure note After verbal consent patient was prepped with alcohol swabs and a 27-gauge 1-1/2 inch needle was injected into the ganglion cyst under ultrasound guidance. Patient did have 3 cc of 0.5% Marcaine injected. Patient then had significant amount of ganglion cyst material removed.  1 cc of Kenalog injected, post injection instructions given.       Impression and Recommendations:     This case required medical decision making of moderate complexity.

## 2014-03-16 ENCOUNTER — Encounter: Payer: Self-pay | Admitting: Family Medicine

## 2014-03-16 ENCOUNTER — Ambulatory Visit (INDEPENDENT_AMBULATORY_CARE_PROVIDER_SITE_OTHER): Payer: Federal, State, Local not specified - PPO | Admitting: Family Medicine

## 2014-03-16 VITALS — BP 127/88 | HR 89 | Temp 98.4°F | Resp 18 | Ht 74.0 in | Wt 240.0 lb

## 2014-03-16 DIAGNOSIS — J209 Acute bronchitis, unspecified: Secondary | ICD-10-CM

## 2014-03-16 MED ORDER — ALBUTEROL SULFATE HFA 108 (90 BASE) MCG/ACT IN AERS
2.0000 | INHALATION_SPRAY | RESPIRATORY_TRACT | Status: DC | PRN
Start: 2014-03-16 — End: 2015-10-14

## 2014-03-16 MED ORDER — PREDNISONE 20 MG PO TABS: ORAL_TABLET | ORAL | Status: DC

## 2014-03-16 NOTE — Assessment & Plan Note (Signed)
Mild RAD component. Suspect viral etiology. Depo-medrol 80mg  IM today. Start prednisone tomorrow: 60mg  qd x 3d, then 40mg  qd x 3d, then 20mg  qd x 3d. ProAir 2 p q4h prn. Hycodan susp 1-2 tsp q6h prn, #246ml (this rx was written on rx paper b/c EMR failed while pt was here). Call if not improving in 3d and I'll add antibiotic.

## 2014-03-16 NOTE — Progress Notes (Signed)
OFFICE NOTE  03/16/2014  CC:  Chief Complaint  Patient presents with  . Cough    x Friday  . Nasal Congestion  . Headache   HPI: Patient is a 48 y.o. Caucasian male who is here for cough. Onset 1 wk ago, severe cough with slight runny nose and sneezing.  Feels wheezy in chest.  No fever.  No chest pain. Nose stuffy.  No ST.  HA+ (from coughing, he thinks).  Sx's not improving any. Mucinex no help, nyquil hs no help.  Pertinent PMH:  Past medical, surgical, social, and family history reviewed and no changes are noted since last office visit.  MEDS:  Outpatient Prescriptions Prior to Visit  Medication Sig Dispense Refill  . atorvastatin (LIPITOR) 40 MG tablet Take 40 mg by mouth daily.      [name] OMEPRAZOLE PO Take by mouth.       No facility-administered medications prior to visit.    PE: Blood pressure 127/88, pulse 89, temperature 98.4 F (36.9 C), temperature source Temporal, resp. rate 18, height 6\' 2"  (1.88 m), weight 240 lb (108.863 kg), SpO2 98.00%. VS: noted--normal. Gen: alert, NAD, NONTOXIC APPEARING. HEENT: eyes without injection, drainage, or swelling.  Ears: EACs clear, TMs with normal light reflex and landmarks.  Nose: Clear rhinorrhea, with some dried, crusty exudate adherent to mildly injected mucosa.  No purulent d/c.  Mild diffuse paranasal sinus TTP.  No facial swelling.  Throat and mouth without focal lesion.  No pharyngial swelling, erythema, or exudate.   Neck: supple, no LAD.   LUNGS: CTA bilat on inspiration, but with forced exhalation he has coarse wheezing and excessive post-exhalation coughing, nonlabored resps.   Good aeration. CV: RRR, no m/r/g. EXT: no c/c/e SKIN: no rash  LAB: none  IMPRESSION AND PLAN:  Acute bronchitis Mild RAD component. Suspect viral etiology. Depo-medrol 80mg  IM today. Start prednisone tomorrow: 60mg  qd x 3d, then 40mg  qd x 3d, then 20mg  qd x 3d. ProAir 2 p q4h prn. Hycodan susp 1-2 tsp q6h prn, #227ml (this rx was  written on rx paper b/c EMR failed while pt was here). Call if not improving in 3d and I'll add antibiotic.   An After Visit Summary was printed and given to the patient.  FOLLOW UP: prn

## 2014-03-16 NOTE — Progress Notes (Signed)
Pre visit review using our clinic review tool, if applicable. No additional management support is needed unless otherwise documented below in the visit note. 

## 2014-03-23 ENCOUNTER — Telehealth: Payer: Self-pay | Admitting: Family Medicine

## 2014-03-23 ENCOUNTER — Other Ambulatory Visit: Payer: Self-pay | Admitting: Family Medicine

## 2014-03-23 MED ORDER — PREDNISONE 20 MG PO TABS
ORAL_TABLET | ORAL | Status: DC
Start: 2014-03-23 — End: 2015-10-14

## 2014-03-23 MED ORDER — AZITHROMYCIN 250 MG PO TABS
ORAL_TABLET | ORAL | Status: DC
Start: 2014-03-23 — End: 2014-04-30

## 2014-03-23 NOTE — Telephone Encounter (Signed)
Pt sent mychart message saying he was not much better. I sent message back to him telling him the plan but will you call him in case he doesn't check his computer mail messages. Tell him I sent in rx for antibiotic (azithromycin) and also more prednisone (tell him to pick up the prednisone rx I sent in today and take it as directed on this rx, even though he is in the midst of taking a prednisone taper that I had rx'd a few days ago when I first saw him. Call or return if not feeling significant improvement in 3d.-thx

## 2014-03-24 NOTE — Telephone Encounter (Signed)
Patient aware.

## 2014-03-30 ENCOUNTER — Encounter: Payer: Self-pay | Admitting: Family Medicine

## 2014-03-30 ENCOUNTER — Ambulatory Visit (INDEPENDENT_AMBULATORY_CARE_PROVIDER_SITE_OTHER): Payer: Federal, State, Local not specified - PPO | Admitting: Family Medicine

## 2014-03-30 VITALS — BP 134/84 | HR 82 | Temp 97.9°F | Ht 75.0 in | Wt 246.0 lb

## 2014-03-30 DIAGNOSIS — J069 Acute upper respiratory infection, unspecified: Secondary | ICD-10-CM

## 2014-03-30 DIAGNOSIS — M6740 Ganglion, unspecified site: Secondary | ICD-10-CM

## 2014-03-30 DIAGNOSIS — R220 Localized swelling, mass and lump, head: Secondary | ICD-10-CM

## 2014-03-30 DIAGNOSIS — R221 Localized swelling, mass and lump, neck: Secondary | ICD-10-CM | POA: Insufficient documentation

## 2014-03-30 MED ORDER — CEFTRIAXONE SODIUM 1 G IJ SOLR
1.0000 g | Freq: Once | INTRAMUSCULAR | Status: AC
  Administered 2014-03-30: 1 g via INTRAMUSCULAR

## 2014-03-30 MED ORDER — AMOXICILLIN-POT CLAVULANATE 875-125 MG PO TABS: 1.0000 | ORAL_TABLET | Freq: Two times a day (BID) | ORAL | Status: DC

## 2014-03-30 NOTE — Progress Notes (Signed)
[facility] [address] [place], [place] [address] Phone: [phone] Subjective:     Followup lateral knee cyst  [id number] Jacob James is a 48 y.o. male coming in with complaint of lateral knee cyst. Patient was found previously and did have a fibular cyst. Patient states that after the aspiration it has not come back. Patient denies any type of pain. Additionally the some mild discomfort with palpation but otherwise significantly better than previous. Overall patient is very happy with the results.  Patient states that he is having some difficulty with swallowing. Patient has had a long history of tonsillitis and had his tonsils removed within the year. Patient was following up with Dr. Janace Hoard and states that everything was going fairly well. Patient states the last 2 weeks he has had this difficulty and his uvula has been inflamed. Patient has been on antibiotics for the last 2 weeks without any significant improvement. Patient was given azithromycin it states. Going through his chart I do not see any other medications at this time. Patient states that he does have fullness of the year as well the next few difficult. Denies any trouble breathing but states that it does cause him to feel like he has coughing fits from time to time.     Past medical history, social, surgical and family history all reviewed in electronic medical record.   Review of Systems: No headache, visual changes, nausea, vomiting, diarrhea, constipation, dizziness, abdominal pain, skin rash, fevers, chills, night sweats, weight loss, swollen lymph nodes, body aches, joint swelling, muscle aches, chest pain, shortness of breath, mood changes.   Objective Blood pressure 134/84, pulse 82, height 6\' 3"  (1.905 m), weight 246 lb (111.585 kg), SpO2 97.00%.  General: No apparent distress alert and oriented x3 mood and affect normal, dressed appropriately.  HEENT: Pupils equal, extraocular  movements intact  Respiratory: Patient's speak in full sentences and does not appear short of breath lungs are clear to auscultation bilaterally. Patient throat though does have mild erythema of the posterior pharynx and patient does have significant redness of the uvula. Does appear to be elongated. Tympanic membranes visualized bilaterally and no significant air-fluid level and no erythema. Cardiovascular: No lower extremity edema, non tender, no erythema  Skin: Warm dry intact with no signs of infection or rash on extremities or on axial skeleton.  Abdomen: Soft nontender  Neuro: Cranial nerves II through XII are intact, neurovascularly intact in all extremities with 2+ DTRs and 2+ pulses.  Lymph: No lymphadenopathy of posterior or anterior cervical chain or axillae bilaterally.  Gait normal with good balance and coordination.  MSK:  Non tender with full range of motion and good stability and symmetric strength and tone of shoulders, elbows, wrist, hip, and ankles bilaterally.   Knee: right On inspection patient does have what appears to be a cyst formation on the fibula head but significantly smaller.  Palpation normal with no warmth, joint line tenderness, patellar tenderness, or condyle tenderness. ROM full in flexion and extension and lower leg rotation. Ligaments with solid consistent endpoints including ACL, PCL, LCL, MCL. Negative Mcmurray's, Apley's, and Thessalonian tests. Non painful patellar compression. Patellar glide without crepitus. Patellar and quadriceps tendons unremarkable. Hamstring and quadriceps strength is normal.  Contralateral knee unremarkable. Patient's feet are in supination.  MSK US performed of: Right knee This study was ordered, performed, and interpreted by Charlann Boxer D.O.  Knee: All structures visualized. Anteromedial, anterolateral, posteromedial, and posterolateral menisci unremarkable without  tearing, fraying, effusion, or displacement. Her  patient's fibular head there appears to be a cystic structure with heterotropic material. Significantly smaller than previous exam Patellar Tendon unremarkable on long and transverse views without effusion. No abnormality of prepatellar bursa. LCL and MCL unremarkable on long and transverse views. No abnormality of origin of medial or lateral head of the gastrocnemius.  IMPRESSION:  Fibular head ganglion cyst status post aspiration 2 weeks ago.      Impression and Recommendations:     This case required medical decision making of moderate complexity.

## 2014-03-30 NOTE — Assessment & Plan Note (Signed)
Patient does have an elongated swollen uvula. Patient does give a history of multiple abscess of his tonsils before removal. Patient is breathing comfortably at this time with good pulse ox and no temperature. Did recommend and patient does followup with his ENT physician. In the meantime patient was given a prescription for Augmentin as well as given a shot of ceftriaxone. Patient can follow up with me on an as-needed basis.  Spent greater than 25 minutes with patient face-to-face and had greater than 50% of counseling including as described above in assessment and plan.

## 2014-03-30 NOTE — Patient Instructions (Addendum)
It is good to see you.  I am glad you are doing well I would see Dr. Janace Hoard. I hope you feel better.  See me when you need me.

## 2014-03-30 NOTE — Assessment & Plan Note (Signed)
Patient is doing remarkably well at this time. Encourage patient to get a compression sleeve for different activity. We did address that there is a 50% chance that this can recannulate over the course of time. Patient though will come back if it starts giving him discomfort again. Overall the patient is doing well and will follow up on an as-needed basis.

## 2014-04-14 ENCOUNTER — Encounter: Payer: Self-pay | Admitting: Family Medicine

## 2014-04-14 DIAGNOSIS — R197 Diarrhea, unspecified: Secondary | ICD-10-CM

## 2014-04-15 NOTE — Telephone Encounter (Signed)
Pls contact pt and tell him I would like him to come by and pick up a stool collection kit for stool testing to be done.  I'll enter orders stool tests as future, approximate.-thx

## 2014-04-15 NOTE — Telephone Encounter (Signed)
Patient aware to come by and pick up stool collection kit.

## 2014-04-19 ENCOUNTER — Other Ambulatory Visit: Payer: Self-pay

## 2014-04-19 DIAGNOSIS — R197 Diarrhea, unspecified: Secondary | ICD-10-CM

## 2014-04-22 ENCOUNTER — Other Ambulatory Visit: Payer: Self-pay

## 2014-04-22 ENCOUNTER — Other Ambulatory Visit: Payer: Federal, State, Local not specified - PPO

## 2014-04-22 DIAGNOSIS — R197 Diarrhea, unspecified: Secondary | ICD-10-CM

## 2014-04-23 ENCOUNTER — Other Ambulatory Visit: Payer: Self-pay

## 2014-04-23 ENCOUNTER — Other Ambulatory Visit: Payer: Federal, State, Local not specified - PPO

## 2014-04-23 DIAGNOSIS — R197 Diarrhea, unspecified: Secondary | ICD-10-CM

## 2014-04-23 LAB — HEMOCCULT SLIDES (X 3 CARDS)
OCCULT 1: NEGATIVE
OCCULT 2: NEGATIVE

## 2014-04-29 ENCOUNTER — Encounter (HOSPITAL_BASED_OUTPATIENT_CLINIC_OR_DEPARTMENT_OTHER): Payer: Self-pay | Admitting: Emergency Medicine

## 2014-04-29 ENCOUNTER — Emergency Department (HOSPITAL_BASED_OUTPATIENT_CLINIC_OR_DEPARTMENT_OTHER)
Admission: EM | Admit: 2014-04-29 | Discharge: 2014-04-30 | Disposition: A | Discharge status: Home / Self Care | Payer: Federal, State, Local not specified - PPO | Attending: Emergency Medicine | Admitting: Emergency Medicine

## 2014-04-29 DIAGNOSIS — M2550 Pain in unspecified joint: Secondary | ICD-10-CM

## 2014-04-29 DIAGNOSIS — E669 Obesity, unspecified: Secondary | ICD-10-CM | POA: Insufficient documentation

## 2014-04-29 DIAGNOSIS — K219 Gastro-esophageal reflux disease without esophagitis: Secondary | ICD-10-CM | POA: Insufficient documentation

## 2014-04-29 DIAGNOSIS — Z792 Long term (current) use of antibiotics: Secondary | ICD-10-CM | POA: Insufficient documentation

## 2014-04-29 DIAGNOSIS — E785 Hyperlipidemia, unspecified: Secondary | ICD-10-CM | POA: Insufficient documentation

## 2014-04-29 DIAGNOSIS — Z8679 Personal history of other diseases of the circulatory system: Secondary | ICD-10-CM | POA: Insufficient documentation

## 2014-04-29 DIAGNOSIS — Z8619 Personal history of other infectious and parasitic diseases: Secondary | ICD-10-CM | POA: Insufficient documentation

## 2014-04-29 DIAGNOSIS — Z87828 Personal history of other (healed) physical injury and trauma: Secondary | ICD-10-CM | POA: Insufficient documentation

## 2014-04-29 DIAGNOSIS — Z79899 Other long term (current) drug therapy: Secondary | ICD-10-CM | POA: Insufficient documentation

## 2014-04-29 LAB — URIC ACID: URIC ACID, SERUM: 6.4 mg/dL (ref 4.0–7.8)

## 2014-04-29 MED ORDER — KETOROLAC TROMETHAMINE 30 MG/ML IJ SOLN
30.0000 mg | Freq: Once | INTRAMUSCULAR | Status: AC
  Administered 2014-04-29: 30 mg via INTRAVENOUS
  Filled 2014-04-29: qty 1

## 2014-04-29 NOTE — ED Notes (Signed)
C/o pain to left ankle, right knee, right elbow and left shoulder )left vs right verified) x 2-3 hours-denies injury
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
## 2014-04-29 NOTE — ED Notes (Signed)
MD at bedside. 

## 2014-04-30 ENCOUNTER — Encounter: Payer: Self-pay | Admitting: Family Medicine

## 2014-04-30 ENCOUNTER — Ambulatory Visit (INDEPENDENT_AMBULATORY_CARE_PROVIDER_SITE_OTHER): Payer: Federal, State, Local not specified - PPO | Admitting: Family Medicine

## 2014-04-30 VITALS — BP 129/86 | HR 87 | Temp 99.3°F | Resp 18 | Ht 74.0 in | Wt 246.0 lb

## 2014-04-30 DIAGNOSIS — M130 Polyarthritis, unspecified: Secondary | ICD-10-CM

## 2014-04-30 DIAGNOSIS — R197 Diarrhea, unspecified: Secondary | ICD-10-CM

## 2014-04-30 LAB — CBC WITH DIFFERENTIAL/PLATELET
BASOS ABS: 0 10*3/uL (ref 0.0–0.1)
Basophils Relative: 0 % (ref 0–1)
EOS ABS: 0.2 10*3/uL (ref 0.0–0.7)
Eosinophils Relative: 1 % (ref 0–5)
HCT: 41.8 % (ref 39.0–52.0)
Hemoglobin: 14.2 g/dL (ref 13.0–17.0)
LYMPHS ABS: 2.8 10*3/uL (ref 0.7–4.0)
Lymphocytes Relative: 15 % (ref 12–46)
MCH: 28.1 pg (ref 26.0–34.0)
MCHC: 34 g/dL (ref 30.0–36.0)
MCV: 82.6 fL (ref 78.0–100.0)
Monocytes Absolute: 1.5 10*3/uL — ABNORMAL HIGH (ref 0.1–1.0)
Monocytes Relative: 8 % (ref 3–12)
NEUTROS ABS: 14 10*3/uL — AB (ref 1.7–7.7)
Neutrophils Relative %: 76 % (ref 43–77)
Platelets: 286 10*3/uL (ref 150–400)
RBC: 5.06 MIL/uL (ref 4.22–5.81)
RDW: 14.7 % (ref 11.5–15.5)
WBC: 18.5 10*3/uL — ABNORMAL HIGH (ref 4.0–10.5)

## 2014-04-30 LAB — COMPREHENSIVE METABOLIC PANEL
ALT: 23 U/L (ref 0–53)
AST: 15 U/L (ref 0–37)
Albumin: 4.3 g/dL (ref 3.5–5.2)
Alkaline Phosphatase: 73 U/L (ref 39–117)
BILIRUBIN TOTAL: 0.7 mg/dL (ref 0.3–1.2)
BUN: 11 mg/dL (ref 6–23)
CO2: 25 mEq/L (ref 19–32)
Calcium: 9.7 mg/dL (ref 8.4–10.5)
Chloride: 100 mEq/L (ref 96–112)
Creatinine, Ser: 0.9 mg/dL (ref 0.50–1.35)
GFR calc non Af Amer: >90 mL/min (ref >90)
GLUCOSE: 96 mg/dL (ref 70–99)
Potassium: 3.7 mEq/L (ref 3.7–5.3)
Sodium: 140 mEq/L (ref 137–147)
TOTAL PROTEIN: 8.4 g/dL — AB (ref 6.0–8.3)

## 2014-04-30 LAB — RHEUMATOID FACTOR: Rhuematoid fact SerPl-aCnc: <10 IU/mL (ref <=14)

## 2014-04-30 LAB — C-REACTIVE PROTEIN: CRP: 5.3 mg/dL (ref 0.5–20.0)

## 2014-04-30 LAB — SEDIMENTATION RATE: Sed Rate: 10 mm/hr (ref 0–16)

## 2014-04-30 MED ORDER — PREDNISONE 10 MG PO TABS: 20.0000 mg | ORAL_TABLET | Freq: Two times a day (BID) | ORAL | Status: DC

## 2014-04-30 MED ORDER — HYDROCODONE-ACETAMINOPHEN 5-325 MG PO TABS: 1.0000 | ORAL_TABLET | Freq: Four times a day (QID) | ORAL | Status: DC | PRN

## 2014-04-30 NOTE — ED Notes (Signed)
MD at bedside. 

## 2014-04-30 NOTE — Discharge Instructions (Signed)
Prednisone as prescribed. Hydrocodone as prescribed as needed for pain.  We will call you if your pending studies indicate that you require further treatment.  Return to the emergency department if you develop high fever, worsening pain, or other new and concerning symptoms.   Arthralgia Your caregiver has diagnosed you as suffering from an arthralgia. Arthralgia means there is pain in a joint. This can come from many reasons including:  Bruising the joint which causes soreness (inflammation) in the joint.  Wear and tear on the joints which occur as we grow older (osteoarthritis).  Overusing the joint.  Various forms of arthritis.  Infections of the joint. Regardless of the cause of pain in your joint, most of these different pains respond to anti-inflammatory drugs and rest. The exception to this is when a joint is infected, and these cases are treated with antibiotics, if it is a bacterial infection. HOME CARE INSTRUCTIONS   Rest the injured area for as long as directed by your caregiver. Then slowly start using the joint as directed by your caregiver and as the pain allows. Crutches as directed may be useful if the ankles, knees or hips are involved. If the knee was splinted or casted, continue use and care as directed. If an stretchy or elastic wrapping bandage has been applied today, it should be removed and re-applied every 3 to 4 hours. It should not be applied tightly, but firmly enough to keep swelling down. Watch toes and feet for swelling, bluish discoloration, coldness, numbness or excessive pain. If any of these problems (symptoms) occur, remove the ace bandage and re-apply more loosely. If these symptoms persist, contact your caregiver or return to this location.  For the first 24 hours, keep the injured extremity elevated on pillows while lying down.  Apply ice for 15-20 minutes to the sore joint every couple hours while awake for the first half day. Then 03-04 times per day  for the first 48 hours. Put the ice in a plastic bag and place a towel between the bag of ice and your skin.  Wear any splinting, casting, elastic bandage applications, or slings as instructed.  Only take over-the-counter or prescription medicines for pain, discomfort, or fever as directed by your caregiver. Do not use aspirin immediately after the injury unless instructed by your physician. Aspirin can cause increased bleeding and bruising of the tissues.  If you were given crutches, continue to use them as instructed and do not resume weight bearing on the sore joint until instructed. Persistent pain and inability to use the sore joint as directed for more than 2 to 3 days are warning signs indicating that you should see a caregiver for a follow-up visit as soon as possible. Initially, a hairline fracture (break in bone) may not be evident on X-rays. Persistent pain and swelling indicate that further evaluation, non-weight bearing or use of the joint (use of crutches or slings as instructed), or further X-rays are indicated. X-rays may sometimes not show a small fracture until a week or 10 days later. Make a follow-up appointment with your own caregiver or one to whom we have referred you. A radiologist (specialist in reading X-rays) may read your X-rays. Make sure you know how you are to obtain your X-ray results. Do not assume everything is normal if you do not hear from [place]. SEEK MEDICAL CARE IF: Bruising, swelling, or pain increases. SEEK IMMEDIATE MEDICAL CARE IF:   Your fingers or toes are numb or blue.  The pain  is not responding to medications and continues to stay the same or get worse.  The pain in your joint becomes severe.  You develop a fever over 102 F (38.9 C).  It becomes impossible to move or use the joint. MAKE SURE YOU:   Understand these instructions.  Will watch your condition.  Will get help right away if you are not doing well or get worse. Document Released:  10/22/2005 Document Revised: 01/14/2012 Document Reviewed: 06/09/2008 [employer] Patient Information 2015 [place], [place]. This information is not intended to replace advice given to you by your health care provider. Make sure you discuss any questions you have with your health care provider.

## 2014-04-30 NOTE — Progress Notes (Signed)
Pre visit review using our clinic review tool, if applicable. No additional management support is needed unless otherwise documented below in the visit note. 

## 2014-04-30 NOTE — Progress Notes (Addendum)
OFFICE NOTE  04/30/2014  CC:  Chief Complaint  Patient presents with  . Hospitalization Follow-up  . Leg Pain  . Shoulder Pain    left  . Elbow Injury    right      HPI: Patient is a 48 y.o. Caucasian male who is here for ED f/u: went to ED yesterday for acute onset of polyarthralgias, reviewed this note and labs today.  He was rx'd prednisone after being given a shot of toradol.  Patient reports about 3 wks of malaise, burning in abdomen, and 8-12 loose BM's per day that are nonbloody. Then yesterday afternoon he developed a mild HA and then severe sharp pains in R elbow, R knee (esp back of knee), left ankle, left shoulder.  Says no joint swelling noted except right knee, which he says has gone down some now, no redness to joints.  Toradol in ED helped somewhat.  Ibup this AM helped a little.  He has not yet started the prednisone (or vicodin) rx'd by EDP.  Denies fever/chills, n/v, rash, or known tick bites.   Pertinent PMH:  Past medical, surgical, social, and family history reviewed and no changes are noted since last office visit.  MEDS:  Outpatient Prescriptions Prior to Visit  Medication Sig Dispense Refill  . albuterol (PROAIR HFA) 108 (90 BASE) MCG/ACT inhaler Inhale 2 puffs into the lungs every 4 (four) hours as needed for wheezing or shortness of breath.  1 Inhaler  0  . atorvastatin (LIPITOR) 40 MG tablet Take 40 mg by mouth daily.      [name] OMEPRAZOLE PO Take by mouth.      . Probiotic Product (PROBIOTIC DAILY PO) Take by mouth.      [name] amoxicillin-clavulanate (AUGMENTIN) 875-125 MG per tablet Take 1 tablet by mouth 2 (two) times daily.  20 tablet  0  . azithromycin (ZITHROMAX) 250 MG tablet 2 tabs po qd x 1d, then 1 tab po qd x 4d  6 each  0  . predniSONE (DELTASONE) 20 MG tablet 3 tabs po qd x 3d, then 2 tabs po qd x 3d, then 1 tab po qd x 3d  18 tablet  0  . HYDROcodone-acetaminophen (NORCO) 5-325 MG per tablet Take 1-2 tablets by mouth every 6 (six) hours as needed.   15 tablet  0  . predniSONE (DELTASONE) 10 MG tablet Take 2 tablets (20 mg total) by mouth 2 (two) times daily.  20 tablet  0   No facility-administered medications prior to visit.    PE: Blood pressure 129/86, pulse 87, temperature 99.3 F (37.4 C), temperature source Temporal, resp. rate 18, height 6\' 2"  (1.88 m), weight 246 lb (111.585 kg), SpO2 95.00%. Gen: tired but nontoxic-appearing.  Moves slowly from chair to exam table. UYQ:IHKV: no injection, icteris, swelling, or exudate.  EOMI, PERRLA. Mouth: lips without lesion/swelling.  Oral mucosa pink and moist. Oropharynx without erythema, exudate, or swelling.  Neck - No masses or thyromegaly or limitation in range of motion Mild soreness with palpation all along central spine region.   CV: RRR, no m/r/g.   LUNGS: CTA bilat, nonlabored resps, good aeration in all lung fields. EXT: no edema, clubbing, or cyanosis. Tender to touch in R elbow just proximal to olecranon process but no red or warmth. Tender in right knee, with mild effusion noted and mild warmth but no erythema.  ROM fully intact. Tender in L ankle and L shoulder--no swelling, erythema, warmth, or ROM limitation in these joints.  Skin - no sores or suspicious lesions or rashes or color changes   IMPRESSION AND PLAN:  1) Recent 3 wks of malaise and abd pain with diarrhea.  Now with polyarthralgia and one joint with evidence of inflammation. DDX: Viral syndrome, inflammatory arthritis (assoc with IBD?), tick borne disease less likely given lack of fever or significant headaches---plus diarrhea not common among tick borne illnesses. Hard to fit this all together, and perhaps it is two different processes. At any rate, he appears truly in pain and I'll proceed with work up by first tapping his right knee and sending fluid for analysis. Will also check a few additional labs (CRP, ANA, Rh factor, CCP IgA, RMSF titers, Borrelia burgdorferi titers. I recommended pt start his  prednisone rx'd by the EDP yesterday, also vicodin prn.  An After Visit Summary was printed and given to the patient.  FOLLOW UP: 7-10 days (also, To be determined based on results of pending workup).

## 2014-05-01 ENCOUNTER — Encounter: Payer: Self-pay | Admitting: Family Medicine

## 2014-05-01 LAB — SYNOVIAL CELL COUNT + DIFF, W/ CRYSTALS
CRYSTALS FLUID: NONE SEEN
EOSINOPHILS-SYNOVIAL: 0 % (ref 0–1)
Lymphocytes-Synovial Fld: 4 % (ref 0–20)
MONOCYTE/MACROPHAGE: 4 % — AB (ref 50–90)
Neutrophil, Synovial: 92 % — ABNORMAL HIGH (ref 0–25)
WBC, SYNOVIAL: 5660 uL — AB (ref 0–200)

## 2014-05-02 ENCOUNTER — Encounter: Payer: Self-pay | Admitting: Family Medicine

## 2014-05-03 ENCOUNTER — Other Ambulatory Visit: Payer: Self-pay | Admitting: Family Medicine

## 2014-05-03 ENCOUNTER — Other Ambulatory Visit: Payer: Self-pay

## 2014-05-03 DIAGNOSIS — M130 Polyarthritis, unspecified: Secondary | ICD-10-CM

## 2014-05-03 DIAGNOSIS — D72829 Elevated white blood cell count, unspecified: Secondary | ICD-10-CM

## 2014-05-03 LAB — ROCKY MTN SPOTTED FVR AB, IGM-BLOOD: RMSF IgM: 0.19 IV (ref 0.00–0.89)

## 2014-05-03 LAB — ANA: Anti Nuclear Antibody(ANA): NEGATIVE

## 2014-05-03 LAB — B. BURGDORFI ANTIBODIES: B BURGDORFERI AB IGG+ IGM: 0.5 {ISR}

## 2014-05-03 LAB — ROCKY MTN SPOTTED FVR AB, IGG-BLOOD: RMSF IgG: 0.12 IV

## 2014-05-04 LAB — CLOSTRIDIUM DIFFICILE BY PCR: Toxigenic C. Difficile by PCR: DETECTED — CR

## 2014-05-04 LAB — BODY FLUID CULTURE
Gram Stain: NONE SEEN
Organism ID, Bacteria: NO GROWTH

## 2014-05-04 LAB — FECAL LACTOFERRIN, QUANT: Lactoferrin: POSITIVE

## 2014-05-04 LAB — GIARDIA/CRYPTOSPORIDIUM (EIA)
CRYPTOSPORIDIUM SCREEN (EIA) (SOL): NEGATIVE
GIARDIA SCREEN (EIA): NEGATIVE

## 2014-05-05 ENCOUNTER — Encounter: Payer: Self-pay | Admitting: Family Medicine

## 2014-05-05 ENCOUNTER — Other Ambulatory Visit: Payer: Self-pay | Admitting: Family Medicine

## 2014-05-05 ENCOUNTER — Ambulatory Visit (INDEPENDENT_AMBULATORY_CARE_PROVIDER_SITE_OTHER): Payer: Federal, State, Local not specified - PPO | Admitting: Family Medicine

## 2014-05-05 VITALS — BP 123/84 | HR 79 | Temp 98.5°F | Resp 18 | Ht 74.0 in | Wt 246.0 lb

## 2014-05-05 DIAGNOSIS — M130 Polyarthritis, unspecified: Secondary | ICD-10-CM

## 2014-05-05 LAB — COMPREHENSIVE METABOLIC PANEL
ALK PHOS: 92 U/L (ref 39–117)
ALT: 139 U/L — ABNORMAL HIGH (ref 0–53)
AST: 59 U/L — AB (ref 0–37)
Albumin: 3.6 g/dL (ref 3.5–5.2)
BILIRUBIN TOTAL: 0.6 mg/dL (ref 0.2–1.2)
BUN: 16 mg/dL (ref 6–23)
CO2: 27 mEq/L (ref 19–32)
Calcium: 9.6 mg/dL (ref 8.4–10.5)
Chloride: 101 mEq/L (ref 96–112)
Creatinine, Ser: 0.7 mg/dL (ref 0.4–1.5)
GFR: 124.9 mL/min (ref >60.00)
GLUCOSE: 95 mg/dL (ref 70–99)
POTASSIUM: 4.2 meq/L (ref 3.5–5.1)
SODIUM: 137 meq/L (ref 135–145)
TOTAL PROTEIN: 7.5 g/dL (ref 6.0–8.3)

## 2014-05-05 LAB — CBC WITH DIFFERENTIAL/PLATELET
BASOS ABS: 0.1 10*3/uL (ref 0.0–0.1)
Basophils Relative: 0.4 % (ref 0.0–3.0)
EOS ABS: 0.1 10*3/uL (ref 0.0–0.7)
EOS PCT: 0.4 % (ref 0.0–5.0)
HEMATOCRIT: 41.1 % (ref 39.0–52.0)
Hemoglobin: 13.8 g/dL (ref 13.0–17.0)
LYMPHS ABS: 3 10*3/uL (ref 0.7–4.0)
Lymphocytes Relative: 17.5 % (ref 12.0–46.0)
MCHC: 33.5 g/dL (ref 30.0–36.0)
MCV: 82.3 fl (ref 78.0–100.0)
MONO ABS: 1.4 10*3/uL — AB (ref 0.1–1.0)
Monocytes Relative: 8.2 % (ref 3.0–12.0)
NEUTROS PCT: 73.5 % (ref 43.0–77.0)
Neutro Abs: 12.4 10*3/uL — ABNORMAL HIGH (ref 1.4–7.7)
Platelets: 400 10*3/uL (ref 150.0–400.0)
RBC: 4.99 Mil/uL (ref 4.22–5.81)
RDW: 15.5 % (ref 11.5–15.5)
WBC: 16.9 10*3/uL — ABNORMAL HIGH (ref 4.0–10.5)

## 2014-05-05 LAB — C-REACTIVE PROTEIN: CRP: 7.2 mg/dL (ref 0.5–20.0)

## 2014-05-05 LAB — SEDIMENTATION RATE: Sed Rate: 49 mm/hr — ABNORMAL HIGH (ref 0–22)

## 2014-05-05 MED ORDER — OXYCODONE HCL 5 MG PO TABS: ORAL_TABLET | ORAL | Status: DC

## 2014-05-05 MED ORDER — METRONIDAZOLE 500 MG PO TABS: 500.0000 mg | ORAL_TABLET | Freq: Three times a day (TID) | ORAL | Status: DC

## 2014-05-05 NOTE — Progress Notes (Signed)
Pre visit review using our clinic review tool, if applicable. No additional management support is needed unless otherwise documented below in the visit note. 

## 2014-05-05 NOTE — Progress Notes (Signed)
OFFICE NOTE  05/05/2014  CC:  Chief Complaint  Patient presents with  . Follow-up     HPI: Patient is a 48 y.o. Caucasian male who is here for 5 day f/u subacute diarrheal illness and acute polyarthritis.   Has finished 5d of prednisone 26m qd.  Says when he took 2 vicodin it eventually brought pain to 5/10 intensity.  When it wore off, the pain returned intensely.  Essentially says he doesn't think prednisone has helped any.  He has also added lots of ibuprofen lately. Since I saw him last, his wrists and hands and left knee have become painful and stiff.  Shoulders and right knee no longer painful or stiff. Feels some right forearm fullness/swelling just distal to elbow, also on/off left calf tightness/fullness. No fever.  No HA.  No rash.  Still with some mild abdominal upset and loose stools.  Looking back, he has had 4 different antibiotics in April and May this year--mostly for swollen uvula during/after tonsillectomy rx'd by his ENT MD and once for prolonged bronchitis illness.  Of note, his uvula did not change with antibiotic treatment.  His recent stool eval returned lactoferrin + and C diff + (this result just came back yesterday evening).   Bact culture is pending but prelim is neg. Blood work has been essentially negative except WBC count 18K at ED visit the day before I first saw him.  Right knee fluid analysis showed no crystals, neg gram stain and culture, and WBC of approx 5K, mostly PMNs. CCP IgA/IgG was 2 (negative), parvo B19 ab pending.  Pertinent PMH:  Past medical, surgical, social, and family history reviewed and no changes are noted since last office visit.  MEDS: Has not started metronidazole or oxycodone listed below.  He has finished prednisone listed below. Outpatient Prescriptions Prior to Visit  Medication Sig Dispense Refill  . albuterol (PROAIR HFA) 108 (90 BASE) MCG/ACT inhaler Inhale 2 puffs into the lungs every 4 (four) hours as needed for wheezing or  shortness of breath.  1 Inhaler  0  . atorvastatin (LIPITOR) 40 MG tablet Take 40 mg by mouth daily.      . metroNIDAZOLE (FLAGYL) 500 MG tablet Take 1 tablet (500 mg total) by mouth 3 (three) times daily.  42 tablet  0  . OMEPRAZOLE PO Take by mouth.      . oxyCODONE (OXY IR/ROXICODONE) 5 MG immediate release tablet 1-2 tabs q6h prn pain  60 tablet  0  . predniSONE (DELTASONE) 10 MG tablet Take 2 tablets (20 mg total) by mouth 2 (two) times daily.  20 tablet  0  . Probiotic Product (PROBIOTIC DAILY PO) Take by mouth.       No facility-administered medications prior to visit.    PE: Blood pressure 123/84, pulse 79, temperature 98.5 F (36.9 C), temperature source Temporal, resp. rate 18, height '6\' 2"'  (1.88 m), weight 246 lb (111.585 kg), SpO2 96.00%. Gen: Alert, well appearing.  Patient is oriented to person, place, time, and situation. ELMR:AJHH no injection, icteris, swelling, or exudate.  EOMI, PERRLA. Mouth: lips without lesion/swelling.  Oral mucosa pink and moist. Oropharynx without erythema, exudate, or swelling.  CV: RRR, no m/r/g.   LUNGS: CTA bilat, nonlabored resps, good aeration in all lung fields. ABd: soft, minimal discomfort with diffuse palpation. EXT: no edema, no soft tissue tenderness.  Forearms and calves are without distention, discoloration, or tenderness. SKIN: no rash Joints: no spine tenderness.  No tenderness, warmth, or swelling in shoulders,  elbows, or wrists.  He has signif TTP in several MCPs, PIPs, and DIPs in both hands.  A few of his MCPs are mildly warm and erythematous on each hand.  No palpable synovitis.  Knees, ankles, and feet are without erythema, warmth, or tenderness.  ROM of all joints intact but hands are mildly stiff.    LABS:  Lab Results  Component Value Date   WBC 18.5* 04/29/2014   HGB 14.2 04/29/2014   HCT 41.8 04/29/2014   MCV 82.6 04/29/2014   PLT 286 04/29/2014   Lab Results  Component Value Date   ESRSEDRATE 10 04/29/2014      Chemistry      Component Value Date/Time   NA 140 04/29/2014 2330   K 3.7 04/29/2014 2330   CL 100 04/29/2014 2330   CO2 25 04/29/2014 2330   BUN 11 04/29/2014 2330   CREATININE 0.90 04/29/2014 2330      Component Value Date/Time   CALCIUM 9.7 04/29/2014 2330   ALKPHOS 73 04/29/2014 2330   AST 15 04/29/2014 2330   ALT 23 04/29/2014 2330   BILITOT 0.7 04/29/2014 2330     Lab Results  Component Value Date   CRP 5.3 04/30/2014   Lab Results  Component Value Date   ANA NEG 04/30/2014   RF <10 04/30/2014  CCP IgG/IgA = 2 (neg)  Stool studies 04/30/14: lactoferrin +, C diff pcr DETECTED, giardia/crypto neg, bact culture neg prelim.  Synovial fluid analysis 04/30/14: no crystals.  WBC 5660, 92% PMNs, no growth, no organisms seen. Borrelia burgdorferi ab 0.5 (neg) RMSF IgM 0.19 (neg) RMSF IgG 0.12 (neg)  IMPRESSION AND PLAN:  1) C diff colitis: start metronidazole 500 mg tid x 14d.  2) Acute (migratory) polyarthritis: blood w/u neg thus far, synovial fluid showing mild inflammation, no response to systemic steroids.  Plan is to get some additional labs (Hep B, Hep C, RPR, HIV, EBV titers/monospot, Strep DNase and ASO titer, HLA B27, repeat CRP/ESR, CBC, CMET).  No further prednisone since this has been unhelpful. Ok to continue 600 mg ibuprofen tid with food.  Oxycodone 50m, 1-2 tabs q6h prn pain--rx handed to pt today. Rheumatology referral ordered.  An After Visit Summary was printed and given to the patient.  FOLLOW UP: 1 wk or to be determined based on results of pending workup and timing of his rheum appt.

## 2014-05-05 NOTE — Patient Instructions (Signed)
Take three OTC ibuprofen tabs (200 mg each) three times a day with food.

## 2014-05-06 ENCOUNTER — Encounter: Payer: Self-pay | Admitting: Family Medicine

## 2014-05-06 LAB — HEPATITIS B SURFACE ANTIGEN: Hepatitis B Surface Ag: NEGATIVE

## 2014-05-06 LAB — HIV ANTIBODY (ROUTINE TESTING W REFLEX): HIV 1&2 Ab, 4th Generation: NONREACTIVE

## 2014-05-06 LAB — RPR

## 2014-05-06 LAB — HEPATITIS C ANTIBODY: HCV Ab: NEGATIVE

## 2014-05-07 LAB — EPSTEIN-BARR VIRUS VCA ANTIBODY PANEL
EBV EA IgG: <5 U/mL (ref <9.0)
EBV NA IgG: 314 U/mL — ABNORMAL HIGH (ref <18.0)
EBV VCA IGG: 63.2 U/mL — AB (ref <18.0)
EBV VCA IgM: <10 U/mL (ref <36.0)

## 2014-05-07 LAB — ANTISTREPTOLYSIN O TITER: ASO: 322 IU/mL (ref <409)

## 2014-05-07 LAB — STOOL CULTURE

## 2014-05-09 LAB — PARVOVIRUS B19 ANTIBODY, IGG AND IGM
PAROVIRUS B19 IGM ABS: 0.2 {index} (ref <0.9)
Parovirus B19 IgG Abs: 1.4 index — ABNORMAL HIGH (ref <0.9)

## 2014-05-09 LAB — ANTI-DNASE B ANTIBODY: Anti-DNAse-B: <95 U/mL (ref <301)

## 2014-05-10 ENCOUNTER — Encounter: Payer: Self-pay | Admitting: Family Medicine

## 2014-05-10 LAB — HLA-B27 ANTIGEN: DNA RESULT: NOT DETECTED

## 2014-05-12 ENCOUNTER — Encounter: Payer: Self-pay | Admitting: Family Medicine

## 2014-05-19 ENCOUNTER — Encounter: Payer: Self-pay | Admitting: Family Medicine

## 2014-05-31 ENCOUNTER — Encounter: Payer: Self-pay | Admitting: Family Medicine

## 2014-06-02 ENCOUNTER — Ambulatory Visit (INDEPENDENT_AMBULATORY_CARE_PROVIDER_SITE_OTHER): Payer: Federal, State, Local not specified - PPO | Admitting: Family Medicine

## 2014-06-02 ENCOUNTER — Other Ambulatory Visit (INDEPENDENT_AMBULATORY_CARE_PROVIDER_SITE_OTHER): Payer: Federal, State, Local not specified - PPO

## 2014-06-02 ENCOUNTER — Encounter: Payer: Self-pay | Admitting: Family Medicine

## 2014-06-02 VITALS — BP 134/82 | HR 78 | Ht 75.0 in | Wt 249.0 lb

## 2014-06-02 DIAGNOSIS — M25561 Pain in right knee: Secondary | ICD-10-CM

## 2014-06-02 DIAGNOSIS — M6740 Ganglion, unspecified site: Secondary | ICD-10-CM

## 2014-06-02 DIAGNOSIS — M25569 Pain in unspecified knee: Secondary | ICD-10-CM

## 2014-06-02 NOTE — Progress Notes (Signed)
[facility] [place], [place] [address] Phone: [phone] Subjective:    I'm seeing this patient by the request  of:  MCGOWEN,PHILIP H, MD   CC: Knee swelling, right  UXL:[id number] Guiseppe Flanagan Golden is a 48 y.o. male coming in with complaint of knee swelling. Patient was seen previously and was diagnosed with a fibular head ganglion cyst. Patient did have an injection and aspiration greater than 2 months ago. Patient states it is doing fairly well. Patient actually has been very sick and did have a polyarthralgia secondary to a C. difficile infection. Patient is responding very well at this time. Patient states that his knee did improve previously and started to slowly increase in size over the course last 2 months. Still about 50% of the size it was previously but states it is starting to give him some mild uncomfortable feeling. Patient had not been doing the exercises, and I do icing and not doing a compression as previously recommended.     Past medical history, social, surgical and family history all reviewed in electronic medical record.   Review of Systems: No headache, visual changes, nausea, vomiting, diarrhea, constipation, dizziness, abdominal pain, skin rash, fevers, chills, night sweats, weight loss, swollen lymph nodes, body aches, joint swelling, muscle aches, chest pain, shortness of breath, mood changes.   Objective Blood pressure 134/82, pulse 78, height 6\' 3"  (1.905 m), weight 249 lb (112.946 kg), SpO2 96.00%.  General: No apparent distress alert and oriented x3 mood and affect normal, dressed appropriately.  HEENT: Pupils equal, extraocular movements intact  Respiratory: Patient's speak in full sentences and does not appear short of breath  Cardiovascular: No lower extremity edema, non tender, no erythema  Skin: Warm dry intact with no signs of infection or rash on extremities or on axial skeleton.  Abdomen: Soft nontender    Neuro: Cranial nerves II through XII are intact, neurovascularly intact in all extremities with 2+ DTRs and 2+ pulses.  Lymph: No lymphadenopathy of posterior or anterior cervical chain or axillae bilaterally.  Gait normal with good balance and coordination.  MSK:  Non tender with full range of motion and good stability and symmetric strength and tone of shoulders, elbows, wrist, hip, and ankles bilaterally.   Knee: right On inspection patient does have what appears to be a cyst formation on the fibula head. This does have fluctuance but no signs of infection. Measures closer to the size of a half-dollar today. This is smaller than previous exam. Palpation normal with no warmth, joint line tenderness, patellar tenderness, or condyle tenderness. ROM full in flexion and extension and lower leg rotation. Ligaments with solid consistent endpoints including ACL, PCL, LCL, MCL. Negative Mcmurray's, Apley's, and Thessalonian tests. Non painful patellar compression. Patellar glide without crepitus. Patellar and quadriceps tendons unremarkable. Hamstring and quadriceps strength is normal.  Contralateral knee unremarkable. Patient's feet are in supination.  MSK US performed of: Right knee This study was ordered, performed, and interpreted by Charlann Boxer D.O.  Knee: All structures visualized. Anteromedial, anterolateral, posteromedial, and posterolateral menisci unremarkable without tearing, fraying, effusion, or displacement. Her patient's fibular head there appears to be a cystic structure with heterotropic material. This likely represents a ganglion cyst Patellar Tendon unremarkable on long and transverse views without effusion. No abnormality of prepatellar bursa. LCL and MCL unremarkable on long and transverse views. No abnormality of origin of medial or lateral head of the gastrocnemius.  IMPRESSION:  Fibular head ganglion cyst.  Procedure note After verbal consent patient was prepped  with alcohol swabs and a 27-gauge 1-1/2 inch needle was injected into the ganglion cyst under ultrasound guidance. Patient did have 3 cc of 0.5% Marcaine injected. Patient then had significant amount of ganglion cyst material removed.  1 cc of Kenalog injected, post injection instructions given.       Impression and Recommendations:     This case required medical decision making of moderate complexity.

## 2014-06-02 NOTE — Telephone Encounter (Signed)
Sent pt mychart email stating: "Minta Balsam,  Dr Anitra Lauth suggests waiting another week for these symptoms to resolve, if it's only the feeling lousy and lower abdominal burning. If you decide not to wait, he suggests seeing a gastroenterologist doctor. Please let me know what you decide.  Thanks,  Lattie Haw, CMA "

## 2014-06-02 NOTE — Telephone Encounter (Signed)
lmom for pt to CB °

## 2014-06-02 NOTE — Telephone Encounter (Signed)
Pt aware.  He didn't seem interested in seeing GI.

## 2014-06-02 NOTE — Telephone Encounter (Signed)
Pls call pt and tell him that as long as the only sx's that remain are "lousy feeling" and burning in lower abd, I would try to give this another week to resolve. If pt NOT willing to wait, then my next recommendation is to see GI MD.-thx

## 2014-06-02 NOTE — Patient Instructions (Signed)
You look great for everything you went through.  We drained the ganglion cyst again.  Ice is your friend.  Consider compression on the area.  See me when you need me.

## 2014-06-02 NOTE — Assessment & Plan Note (Signed)
Patient was given an injection today. Patient's given the suggestion to do compression for the next 48 hours and then as needed. We discussed icing as well. Discuss that there is still a 50% chance that this may come back again but hopefully the frequency will become less and less. Patient to follow up with me on an as-needed basis.  Spent greater than 25 minutes with patient face-to-face and had greater than 50% of counseling including as described above in assessment and plan.

## 2014-07-06 ENCOUNTER — Ambulatory Visit (HOSPITAL_BASED_OUTPATIENT_CLINIC_OR_DEPARTMENT_OTHER)
Admission: RE | Admit: 2014-07-06 | Discharge: 2014-07-06 | Disposition: A | Discharge status: Home / Self Care | Payer: Federal, State, Local not specified - PPO | Source: Ambulatory Visit | Attending: Family Medicine | Admitting: Family Medicine

## 2014-07-06 ENCOUNTER — Ambulatory Visit: Payer: Federal, State, Local not specified - PPO

## 2014-07-06 ENCOUNTER — Encounter: Payer: Self-pay | Admitting: Family Medicine

## 2014-07-06 ENCOUNTER — Ambulatory Visit (INDEPENDENT_AMBULATORY_CARE_PROVIDER_SITE_OTHER): Payer: Federal, State, Local not specified - PPO | Admitting: Family Medicine

## 2014-07-06 VITALS — BP 125/82 | HR 70 | Temp 97.9°F | Resp 16 | Ht 74.0 in | Wt 253.0 lb

## 2014-07-06 DIAGNOSIS — R0789 Other chest pain: Secondary | ICD-10-CM | POA: Diagnosis not present

## 2014-07-06 DIAGNOSIS — R0989 Other specified symptoms and signs involving the circulatory and respiratory systems: Principal | ICD-10-CM | POA: Insufficient documentation

## 2014-07-06 DIAGNOSIS — R1013 Epigastric pain: Secondary | ICD-10-CM

## 2014-07-06 DIAGNOSIS — R0609 Other forms of dyspnea: Secondary | ICD-10-CM

## 2014-07-06 DIAGNOSIS — R5383 Other fatigue: Secondary | ICD-10-CM | POA: Diagnosis not present

## 2014-07-06 DIAGNOSIS — R5381 Other malaise: Secondary | ICD-10-CM

## 2014-07-06 DIAGNOSIS — R0600 Dyspnea, unspecified: Secondary | ICD-10-CM

## 2014-07-06 HISTORY — PX: CARDIOVASCULAR STRESS TEST: SHX262

## 2014-07-06 LAB — CBC WITH DIFFERENTIAL/PLATELET
BASOS ABS: 0 10*3/uL (ref 0.0–0.1)
Basophils Relative: 0.4 % (ref 0.0–3.0)
EOS ABS: 0.1 10*3/uL (ref 0.0–0.7)
Eosinophils Relative: 1.6 % (ref 0.0–5.0)
HCT: 43.6 % (ref 39.0–52.0)
Hemoglobin: 14.7 g/dL (ref 13.0–17.0)
Lymphocytes Relative: 24.6 % (ref 12.0–46.0)
Lymphs Abs: 2.1 10*3/uL (ref 0.7–4.0)
MCHC: 33.7 g/dL (ref 30.0–36.0)
MCV: 81.8 fl (ref 78.0–100.0)
MONO ABS: 0.6 10*3/uL (ref 0.1–1.0)
Monocytes Relative: 6.9 % (ref 3.0–12.0)
Neutro Abs: 5.7 10*3/uL (ref 1.4–7.7)
Neutrophils Relative %: 66.5 % (ref 43.0–77.0)
PLATELETS: 283 10*3/uL (ref 150.0–400.0)
RBC: 5.34 Mil/uL (ref 4.22–5.81)
RDW: 14.8 % (ref 11.5–15.5)
WBC: 8.6 10*3/uL (ref 4.0–10.5)

## 2014-07-06 LAB — COMPREHENSIVE METABOLIC PANEL
ALK PHOS: 53 U/L (ref 39–117)
ALT: 22 U/L (ref 0–53)
AST: 18 U/L (ref 0–37)
Albumin: 4.1 g/dL (ref 3.5–5.2)
BILIRUBIN TOTAL: 0.7 mg/dL (ref 0.2–1.2)
BUN: 10 mg/dL (ref 6–23)
CO2: 29 mEq/L (ref 19–32)
Calcium: 9.8 mg/dL (ref 8.4–10.5)
Chloride: 102 mEq/L (ref 96–112)
Creatinine, Ser: 0.9 mg/dL (ref 0.4–1.5)
GFR: 93.73 mL/min (ref >60.00)
Glucose, Bld: 99 mg/dL (ref 70–99)
Potassium: 4.4 mEq/L (ref 3.5–5.1)
SODIUM: 139 meq/L (ref 135–145)
TOTAL PROTEIN: 7.7 g/dL (ref 6.0–8.3)

## 2014-07-06 MED ORDER — OMEPRAZOLE 40 MG PO CPDR: 40.0000 mg | DELAYED_RELEASE_CAPSULE | Freq: Every day | ORAL | Status: DC
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
## 2014-07-06 NOTE — Progress Notes (Signed)
Pre visit review using our clinic review tool, if applicable. No additional management support is needed unless otherwise documented below in the visit note. 

## 2014-07-07 DIAGNOSIS — R0789 Other chest pain: Secondary | ICD-10-CM | POA: Insufficient documentation

## 2014-07-07 NOTE — Assessment & Plan Note (Signed)
His symptom complex is generally chronic fatigue, chronic chest heaviness with mild feeling of DOE.  Hx of negative w/u 2 yrs ago for same. EKG without ischemic changes or ectopy today or in the past. Today we did CMET, CBC, and set him up for a CXR and exercise myocardial perfusion stress testing.   I also ordered H pylori stool antigen testing: he has been on no PPI for 1 week now and he needs to stay off this for 1 more week before giving his sample for this test. After that he may resume omeprazole but I increased his dosing to 40mg  qd. If cardiac eval normal then will ask GI to see for consideration of upper endoscopy.

## 2014-07-09 ENCOUNTER — Other Ambulatory Visit: Payer: Self-pay | Admitting: Family Medicine

## 2014-07-10 LAB — HELICOBACTER PYLORI  SPECIAL ANTIGEN: H. PYLORI ANTIGEN STOOL: NEGATIVE

## 2014-07-14 ENCOUNTER — Encounter: Payer: Self-pay | Admitting: Cardiology

## 2014-07-15 ENCOUNTER — Ambulatory Visit (HOSPITAL_COMMUNITY): Payer: Federal, State, Local not specified - PPO | Attending: Family Medicine | Admitting: Radiology

## 2014-07-15 VITALS — BP 129/82 | HR 60 | Ht 74.0 in | Wt 251.0 lb

## 2014-07-15 DIAGNOSIS — R5381 Other malaise: Secondary | ICD-10-CM | POA: Diagnosis not present

## 2014-07-15 DIAGNOSIS — R5383 Other fatigue: Secondary | ICD-10-CM | POA: Diagnosis not present

## 2014-07-15 DIAGNOSIS — R0789 Other chest pain: Secondary | ICD-10-CM | POA: Diagnosis present

## 2014-07-15 DIAGNOSIS — R9439 Abnormal result of other cardiovascular function study: Secondary | ICD-10-CM | POA: Insufficient documentation

## 2014-07-15 DIAGNOSIS — R0602 Shortness of breath: Secondary | ICD-10-CM

## 2014-07-15 DIAGNOSIS — R0609 Other forms of dyspnea: Secondary | ICD-10-CM | POA: Diagnosis not present

## 2014-07-15 DIAGNOSIS — R0989 Other specified symptoms and signs involving the circulatory and respiratory systems: Secondary | ICD-10-CM | POA: Insufficient documentation

## 2014-07-15 DIAGNOSIS — R0600 Dyspnea, unspecified: Secondary | ICD-10-CM

## 2014-07-15 MED ORDER — TECHNETIUM TC 99M SESTAMIBI GENERIC - CARDIOLITE
30.0000 | Freq: Once | INTRAVENOUS | Status: AC | PRN
  Administered 2014-07-15: 30 via INTRAVENOUS

## 2014-07-15 MED ORDER — TECHNETIUM TC 99M SESTAMIBI GENERIC - CARDIOLITE
10.0000 | Freq: Once | INTRAVENOUS | Status: AC | PRN
  Administered 2014-07-15: 10 via INTRAVENOUS

## 2014-07-15 NOTE — Progress Notes (Signed)
[facility] [address] [place], [name] [address] [phone]    Cardiology Nuclear Med Study  Jacob James is a 48 y.o. male     MRN : [id number]     DOB: [birth year]  Procedure Date: 07/15/2014  Nuclear Med Background Indication for Stress Test:  Evaluation for Ischemia, and Pending Surgical Clearance for Uvulectomy by Jacob Montane, MD (ENT) History:  No prior known history of CAD, 2003 Myocardial Perfusion Imaging-Normal, EF=63% Cardiac Risk Factors: None  Symptoms:Chest Pain with/without exertion (Continuous> 1 year), but Chest Heaviness last occurrence one week ago, DOE and Fatigue   Nuclear Pre-Procedure Caffeine/Decaff Intake:  None> 12 hrs NPO After: 7:30pm   Lungs:  clear O2 Sat: 95% on room air. IV 0.9% NS with Angio Cath:  22g  IV Site: R Wrist x 1, tolerated well IV Started by:  Jacob Baltimore, RN  Chest Size (in):  48 Cup Size: n/a  Height: 6\' 2"  (1.88 m)  Weight:  251 lb (113.853 kg)  BMI:  Body mass index is 32.21 kg/(m^2). Tech Comments:  N/A    Nuclear Med Study 1 or 2 day study: 1 day  Stress Test Type:  Stress  Reading MD: N/A  Order Authorizing Provider:  Shawnie Dapper, MD  Resting Radionuclide: Technetium 14m Sestamibi  Resting Radionuclide Dose: 11.0 mCi   Stress Radionuclide:  Technetium 38m Sestamibi  Stress Radionuclide Dose: 33.0 mCi           Stress Protocol Rest HR: 60 Stress HR: 153  Rest BP: 129/82 Stress BP: 184/83  Exercise Time (min): 10:01 METS: 11.7   Predicted Max HR: 170 bpm % Max HR: 90 bpm Rate Pressure Product: 28152   Dose of Adenosine (mg):  n/a Dose of Lexiscan: n/a mg  Dose of Atropine (mg): n/a Dose of Dobutamine: n/a mcg/kg/min (at max HR)  Stress Test Technologist: Jacob Baltimore, RN  Nuclear Technologist:  Jacob James, CNMT     Rest Procedure:  Myocardial perfusion imaging was performed at rest 45 minutes following the intravenous administration of Technetium 23m Sestamibi. Rest  ECG: NSR, early repolarization abnormality.  Stress Procedure:  The patient exercised on the treadmill utilizing the Bruce Protocol for 10:01 minutes, RPE=17.The patient stopped due to DOE and complained of chronic baseline chest pain 3-4/10 that did not change with exercise.  Technetium 68m Sestamibi was injected at peak exercise and myocardial perfusion imaging was performed after a brief delay. Stress ECG: No significant ST segment change suggestive of ischemia.  QPS Raw Data Images:  Acquisition technically good; normal left ventricular size. Stress Images:  There is decreased uptake in the anterior and inferior walls. Rest Images:  There is decreased uptake in the anterior and inferior walls. Subtraction (SDS):  No evidence of ischemia. Transient Ischemic Dilatation (Normal <1.22):  0.87 Lung/Heart Ratio (Normal <0.45):  0.37  Quantitative Gated Spect Images QGS EDV:  131 ml QGS ESV:  58 ml  Impression Exercise Capacity:  Good exercise capacity. BP Response:  Normal blood pressure response. Clinical Symptoms:  Patient had chest pain at baseline unchanged with exercise. ECG Impression:  No significant ST segment change suggestive of ischemia. Comparison with Prior Nuclear Study: Compared to Aug 11, 2002, anterior and inferior defects are more prominent.  Overall Impression:  Low risk stress nuclear study with small, moderate intensity, fixed anterior and inferior defects consistent with soft tissue attenuation (cannot R/O small prior infarcts); no ischemia.  LV Ejection Fraction: 56%.  LV  Wall Motion:  NL LV Function; NL Wall Motion  Jacob James

## 2014-07-16 ENCOUNTER — Telehealth: Payer: Self-pay | Admitting: Family Medicine

## 2014-07-16 DIAGNOSIS — Z1211 Encounter for screening for malignant neoplasm of colon: Secondary | ICD-10-CM

## 2014-07-16 NOTE — Telephone Encounter (Signed)
Patient needs a screening colonoscopy referral

## 2014-07-19 NOTE — Telephone Encounter (Signed)
OK, order has been entered.

## 2014-07-20 ENCOUNTER — Encounter: Payer: Self-pay | Admitting: Family Medicine

## 2014-07-22 ENCOUNTER — Encounter: Payer: Self-pay | Admitting: Gastroenterology

## 2014-09-01 ENCOUNTER — Telehealth: Payer: Self-pay

## 2014-09-01 NOTE — Telephone Encounter (Signed)
Message copied by Marlon Pel on Wed Sep 01, 2014  9:03 AM ------      Message from: Lowry Ram      Created: Tue Aug 31, 2014  4:54 PM      Regarding: need for new patient appt       Ranelle Auker: pt was scheduled for direct screening colonoscopy with Dr Fuller Plan 11/10.  According to OV with Dr. Anitra Lauth 9/1 pt needs GI consult for possible EGD for chest pain (if cardiac workup negative).  Somehow pt was scheduled for screening colonoscopy.  I talked to patient and he thought he was having EGD and colonoscopy. I cancelled colonoscopy and need to make new pt appt. I looked at Dr. Lynne Leader new patient appts and did not see one available but lots of follow ups appointments open.  Could you call patient and schedule pt for OV if you can put him in one of the follow up appointments.  Thanks, Juliann Pulse ------

## 2014-09-01 NOTE — Telephone Encounter (Signed)
I scheduled the appt with the patient for 09/13/14 10:45

## 2014-09-13 ENCOUNTER — Encounter: Payer: Self-pay | Admitting: Gastroenterology

## 2014-09-13 ENCOUNTER — Ambulatory Visit (INDEPENDENT_AMBULATORY_CARE_PROVIDER_SITE_OTHER): Payer: Federal, State, Local not specified - PPO | Admitting: Gastroenterology

## 2014-09-13 VITALS — BP 132/90 | HR 80 | Ht 75.0 in | Wt 257.0 lb

## 2014-09-13 DIAGNOSIS — Z1211 Encounter for screening for malignant neoplasm of colon: Secondary | ICD-10-CM

## 2014-09-13 DIAGNOSIS — R0789 Other chest pain: Secondary | ICD-10-CM

## 2014-09-13 MED ORDER — PEG-KCL-NACL-NASULF-NA ASC-C 100 G PO SOLR: 1.0000 | Freq: Once | ORAL | Status: DC

## 2014-09-13 NOTE — Progress Notes (Signed)
    History of Present Illness: This is a 48 year old male, referred by Dr. Anitra Lauth, who has ongoing problems with fatigue, chest heaviness, chest tightness, shortness of breath and difficulty sleeping. He has been treated with omeprazole 20 mg daily and it was increased to 40 mg daily with no impact on his no typical reflux symptoms. Denies weight loss, abdominal pain, constipation, diarrhea, change in stool caliber, melena, hematochezia, nausea, vomiting, dysphagia, reflux symptoms, chest pain.  Review of Systems: Pertinent positive and negative review of systems were noted in the above HPI section. All other review of systems were otherwise negative.  Current Medications, Allergies, Past Medical History, Past Surgical History, Family History and Social History were reviewed in [employer] record.  Physical Exam: General: Well developed , well nourished, no acute distress Head: Normocephalic and atraumatic Eyes:  sclerae anicteric, EOMI Ears: Normal auditory acuity Mouth: No deformity or lesions Neck: Supple, no masses or thyromegaly Lungs: Clear throughout to auscultation Heart: Regular rate and rhythm; no murmurs, rubs or bruits Abdomen: Soft, non tender and non distended. No masses, hepatosplenomegaly or hernias noted. Normal Bowel sounds Rectal: deferred to colonoscopy. Musculoskeletal: Symmetrical with no gross deformities  Skin: No lesions on visible extremities Pulses:  Normal pulses noted Extremities: No clubbing, cyanosis, edema or deformities noted Neurological: Alert oriented x 4, grossly nonfocal Cervical Nodes:  No significant cervical adenopathy Inguinal Nodes: No significant inguinal adenopathy Psychological:  Alert and cooperative. Normal mood and affect  Assessment and Recommendations:  1. Colorectal cancer screening, average risk. Schedule colonoscopy. The risks, benefits, and alternatives to colonoscopy with possible biopsy and possible  polypectomy were discussed with the patient and they consent to proceed.   2. Chest heaviness, chest tightness, shortness of breath, fatigue, difficulty sleeping. Highly unlikely that these symptoms are related to GERD or another upper gastrointestinal disorder. Given the refractory nature of his symptoms with an extensive evaluation that has been unrevealing will proceed with endoscopy to exclude unlikely upper gastrointestinal tract disorders. The risks, benefits, and alternatives to endoscopy with possible biopsy and possible dilation were discussed with the patient and they consent to proceed.

## 2014-09-13 NOTE — Patient Instructions (Signed)
You have been scheduled for an endoscopy. Please follow written instructions given to you at your visit today. If you use inhalers (even only as needed), please bring them with you on the day of your procedure. Your physician has requested that you go to www.startemmi.com and enter the access code given to you at your visit today. This web site gives a general overview about your procedure. However, you should still follow specific instructions given to you by our office regarding your preparation for the procedure.  You have been scheduled for a colonoscopy. Please follow written instructions given to you at your visit today.  Please pick up your prep kit at the pharmacy within the next 1-3 days. If you use inhalers (even only as needed), please bring them with you on the day of your procedure. Your physician has requested that you go to www.startemmi.com and enter the access code given to you at your visit today. This web site gives a general overview about your procedure. However, you should still follow specific instructions given to you by our office regarding your preparation for the procedure.

## 2014-09-14 ENCOUNTER — Ambulatory Visit (AMBULATORY_SURGERY_CENTER): Payer: Federal, State, Local not specified - PPO | Admitting: Gastroenterology

## 2014-09-14 ENCOUNTER — Encounter: Payer: Self-pay | Admitting: Gastroenterology

## 2014-09-14 ENCOUNTER — Encounter: Payer: Federal, State, Local not specified - PPO | Admitting: Gastroenterology

## 2014-09-14 VITALS — BP 127/76 | HR 61 | Temp 98.3°F | Resp 19 | Ht 75.0 in | Wt 257.0 lb

## 2014-09-14 DIAGNOSIS — R0789 Other chest pain: Secondary | ICD-10-CM

## 2014-09-14 MED ORDER — SODIUM CHLORIDE 0.9 % IV SOLN: 500.0000 mL | INTRAVENOUS | Status: DC

## 2014-09-14 NOTE — Progress Notes (Signed)
Procedure ends, to recovery, report given and VSS. 

## 2014-09-14 NOTE — Op Note (Signed)
[facility]  [address] [place], [address]   ENDOSCOPY PROCEDURE REPORT  PATIENT: Jacob James, [place]meier [name]  MR#: [id number] BIRTHDATE: 1966/07/24 , 48  yrs. old GENDER: male ENDOSCOPIST: Ladene Artist, MD, [facility] REFERRED BY:  Ricardo Jericho PROCEDURE DATE:  09/14/2014 PROCEDURE:  EGD, diagnostic ASA CLASS:     Class III INDICATIONS:  chest pain. MEDICATIONS: Monitored anesthesia care and Propofol 250 mg IV TOPICAL ANESTHETIC: none DESCRIPTION OF PROCEDURE: After the risks benefits and alternatives of the procedure were thoroughly explained, informed consent was obtained.  The LB [id number] endoscope was introduced through the mouth and advanced to the second portion of the duodenum , Without limitations.  The instrument was slowly withdrawn as the mucosa was fully examined.   STOMACH: Single diverticulum in the gastric fundus.  The stomach otherwise appeared normal. ESOPHAGUS: The mucosa of the esophagus appeared normal. DUODENUM: The duodenal mucosa showed no abnormalities.  Retroflexed views revealed no abnormalities.     The scope was then withdrawn from the patient and the procedure completed.  COMPLICATIONS: There were no immediate complications.  ENDOSCOPIC IMPRESSION: 1.   Gastric fundus diverticulum 2.   The EGD otherwise appeared normal  RECOMMENDATIONS: 1. Follow-up with your primary MD. Consider evaluation for OSA.  eSigned:  Ladene Artist, MD, [facility] 09/14/2014 3:56 PM

## 2014-09-14 NOTE — Patient Instructions (Signed)

## 2014-09-15 ENCOUNTER — Telehealth: Payer: Self-pay | Admitting: *Deleted

## 2014-09-15 NOTE — Telephone Encounter (Signed)
  Follow up Call-  Call back number 09/14/2014  Post procedure Call Back phone  # [phone]  Permission to leave phone message Yes     Patient questions:  Do you have a fever, pain , or abdominal swelling? No. Pain Score  0 *  Have you tolerated food without any problems? Yes.    Have you been able to return to your normal activities? Yes.    Do you have any questions about your discharge instructions: Diet   No. Medications  No. Follow up visit  No.  Do you have questions or concerns about your Care? No.  Actions: * If pain score is 4 or above: No action needed, pain <4.

## 2014-09-15 NOTE — Telephone Encounter (Signed)
  Follow up Call-  Call back number 09/14/2014  Post procedure Call Back phone  # [phone]  Permission to leave phone message Yes     Patient questions:  Do you have a fever, pain , or abdominal swelling? No. Pain Score  0 *  Have you tolerated food without any problems? Yes.    Have you been able to return to your normal activities? Yes.    Do you have any questions about your discharge instructions: Diet   No. Medications  No. Follow up visit  No.  Do you have questions or concerns about your Care? No.  Actions: * If pain score is 4 or above: No action needed, pain <4.

## 2014-09-22 ENCOUNTER — Encounter: Payer: Federal, State, Local not specified - PPO | Admitting: Gastroenterology

## 2014-10-06 ENCOUNTER — Encounter: Payer: Self-pay | Admitting: Gastroenterology

## 2014-10-12 ENCOUNTER — Encounter: Payer: Federal, State, Local not specified - PPO | Admitting: Gastroenterology

## 2014-11-05 LAB — HM COLONOSCOPY

## 2014-11-15 ENCOUNTER — Encounter: Payer: Self-pay | Admitting: Family Medicine

## 2014-11-15 ENCOUNTER — Ambulatory Visit (INDEPENDENT_AMBULATORY_CARE_PROVIDER_SITE_OTHER): Payer: Federal, State, Local not specified - PPO | Admitting: Family Medicine

## 2014-11-15 VITALS — BP 129/81 | HR 65 | Temp 97.8°F | Resp 18 | Ht 75.0 in | Wt 260.0 lb

## 2014-11-15 DIAGNOSIS — S61219A Laceration without foreign body of unspecified finger without damage to nail, initial encounter: Secondary | ICD-10-CM

## 2014-11-15 DIAGNOSIS — Z23 Encounter for immunization: Secondary | ICD-10-CM

## 2014-11-15 NOTE — Progress Notes (Signed)
Pre visit review using our clinic review tool, if applicable. No additional management support is needed unless otherwise documented below in the visit note. 

## 2014-11-15 NOTE — Progress Notes (Signed)
OFFICE NOTE  11/15/2014  CC:  Chief Complaint  Patient presents with  . Laceration    r middle finger   HPI: Patient is a 48 y.o. Caucasian male who is here for a cut on right middle finger, needs Td. Sustained it last night cutting potatoes preparing dinner.  He has not really washed it out b/c he was too worried about simply trying to stem the bleeding last night.  Pertinent PMH:  Past medical, surgical, social, and family history reviewed and no changes are noted since last office visit.  MEDS:  Outpatient Prescriptions Prior to Visit  Medication Sig Dispense Refill  . albuterol (PROAIR HFA) 108 (90 BASE) MCG/ACT inhaler Inhale 2 puffs into the lungs every 4 (four) hours as needed for wheezing or shortness of breath. 1 Inhaler 0  . atorvastatin (LIPITOR) 40 MG tablet Take 40 mg by mouth daily.    . omeprazole (PRILOSEC) 40 MG capsule Take 1 capsule (40 mg total) by mouth daily. (Patient not taking: Reported on 11/15/2014) 30 capsule 6  . peg 3350 powder (MOVIPREP) 100 G SOLR Take 1 kit (200 g total) by mouth once. (Patient not taking: Reported on 11/15/2014) 1 kit 0   No facility-administered medications prior to visit.    PE: Blood pressure 129/81, pulse 65, temperature 97.8 F (36.6 C), temperature source Oral, resp. rate 18, height 6' 3" (1.905 m), weight 260 lb (117.935 kg), SpO2 95 %. Gen: Alert, well appearing.  Patient is oriented to person, place, time, and situation. Right middle finger tip with superficial square of the finger tip removed (about 1 mm deep). No exudate, bleeding, or surrounding erythema.  IMPRESSION AND PLAN:  Right middle fingertip superficial lac/abrasion: no sign of infection. Irrigated the wound with sterile water today, placed band-aid, gave Tdap.  An After Visit Summary was printed and given to the patient.  FOLLOW UP: prn     

## 2015-01-12 ENCOUNTER — Ambulatory Visit (INDEPENDENT_AMBULATORY_CARE_PROVIDER_SITE_OTHER)
Admission: RE | Admit: 2015-01-12 | Discharge: 2015-01-12 | Disposition: A | Discharge status: Home / Self Care | Payer: Federal, State, Local not specified - PPO | Source: Ambulatory Visit | Attending: Family Medicine | Admitting: Family Medicine

## 2015-01-12 ENCOUNTER — Other Ambulatory Visit (INDEPENDENT_AMBULATORY_CARE_PROVIDER_SITE_OTHER): Payer: Federal, State, Local not specified - PPO

## 2015-01-12 ENCOUNTER — Encounter: Payer: Self-pay | Admitting: Family Medicine

## 2015-01-12 ENCOUNTER — Ambulatory Visit (INDEPENDENT_AMBULATORY_CARE_PROVIDER_SITE_OTHER): Payer: Federal, State, Local not specified - PPO | Admitting: Family Medicine

## 2015-01-12 VITALS — BP 134/88 | HR 86 | Ht 75.0 in | Wt 261.0 lb

## 2015-01-12 DIAGNOSIS — M6740 Ganglion, unspecified site: Secondary | ICD-10-CM

## 2015-01-12 DIAGNOSIS — M25561 Pain in right knee: Secondary | ICD-10-CM

## 2015-01-12 NOTE — Assessment & Plan Note (Signed)
Patient did have aspiration again today. Patient tolerated the procedure very well. This is very close to patient's fibula nerve an will need to monitor closely. Discussed with patient that if he has ever had any foot drop that he needs to be seen immediately. Patient may also need to have surgical removal if this aspiration of the last 3-4 months. Patient though did do very well previously and we will continue.  Spent  25 minutes with patient face-to-face and had greater than 50% of counseling including as described above in assessment and plan.

## 2015-01-12 NOTE — Patient Instructions (Addendum)
Good to see you Lets get an xray today will release in my chart Ice in 6 hours Should feel good in 1 week if not call me and we will consider MRI but you will be fine See me when you need me.

## 2015-01-12 NOTE — Progress Notes (Signed)
  [name] [place], [place] [address] Phone: [phone] Subjective:    I'm seeing this patient by the request  of:  MCGOWEN,PHILIP H, MD   CC: Knee swelling, right  [id number] Jacob James is a 49 y.o. male coming in with complaint of knee swelling. Patient was seen previously and was diagnosed with a fibular head ganglion cyst. Patient did have an injection and aspiration greater than 8 months ago. Patient states overall he is doing significantly better into the last couple weeks.     Past medical history, social, surgical and family history all reviewed in electronic medical record.   Review of Systems: No headache, visual changes, nausea, vomiting, diarrhea, constipation, dizziness, abdominal pain, skin rash, fevers, chills, night sweats, weight loss, swollen lymph nodes, body aches, joint swelling, muscle aches, chest pain, shortness of breath, mood changes.   Objective Blood pressure 134/88, pulse 86, height 6\' 3"  (1.905 m), weight 261 lb (118.389 kg), SpO2 95 %.  General: No apparent distress alert and oriented x3 mood and affect normal, dressed appropriately.  HEENT: Pupils equal, extraocular movements intact  Respiratory: Patient's speak in full sentences and does not appear short of breath  Cardiovascular: No lower extremity edema, non tender, no erythema  Skin: Warm dry intact with no signs of infection or rash on extremities or on axial skeleton.  Abdomen: Soft nontender  Neuro: Cranial nerves II through XII are intact, neurovascularly intact in all extremities with 2+ DTRs and 2+ pulses.  Lymph: No lymphadenopathy of posterior or anterior cervical chain or axillae bilaterally.  Gait normal with good balance and coordination.  MSK:  Non tender with full range of motion and good stability and symmetric strength and tone of shoulders, elbows, wrist, hip, and ankles bilaterally.   Knee: right On inspection patient does have  what appears to be a cyst formation on the fibula head. Palpation normal with no warmth, joint line tenderness, patellar tenderness, or condyle tenderness. ROM full in flexion and extension and lower leg rotation. Ligaments with solid consistent endpoints including ACL, PCL, LCL, MCL. Negative Mcmurray's, Apley's, and Thessalonian tests. Non painful patellar compression. Patellar glide without crepitus. Patellar and quadriceps tendons unremarkable. Hamstring and quadriceps strength is normal.  Contralateral knee unremarkable. Patient's feet are in supination.  MSK US performed of: Right knee This study was ordered, performed, and interpreted by Charlann Boxer D.O.  Knee: All structures visualized. Anteromedial, anterolateral, posteromedial, and posterolateral menisci unremarkable without tearing, fraying, effusion, or displacement. Jacob James patient's fibular head there appears to be a cystic structure with s likely represents a ganglion cyst recurrent Patellar Tendon unremarkable on long and transverse views without effusion. No abnormality of prepatellar bursa. LCL and MCL unremarkable on long and transverse views. No abnormality of origin of medial or lateral head of the gastrocnemius.  IMPRESSION:  Fibular head ganglion cyst septated  Procedure note After verbal consent patient was prepped with alcohol swabs and a 27-gauge 1-1/2 inch needle was injected into the ganglion cyst under ultrasound guidance. Patient did have 1 cc of 0.5% Marcaine injected. Patient then had significant amount of ganglion cyst material removed.  1 cc of Kenalog injected, post injection instructions given.       Impression and Recommendations:     This case required medical decision making of moderate complexity.

## 2015-01-12 NOTE — Progress Notes (Signed)
Pre visit review using our clinic review tool, if applicable. No additional management support is needed unless otherwise documented below in the visit note. 

## 2015-01-26 ENCOUNTER — Other Ambulatory Visit: Payer: Self-pay | Admitting: Family Medicine

## 2015-01-26 MED ORDER — ATORVASTATIN CALCIUM 40 MG PO TABS: 40.0000 mg | ORAL_TABLET | Freq: Every day | ORAL | Status: DC

## 2015-10-14 ENCOUNTER — Ambulatory Visit (INDEPENDENT_AMBULATORY_CARE_PROVIDER_SITE_OTHER): Payer: Federal, State, Local not specified - PPO | Admitting: Family Medicine

## 2015-10-14 ENCOUNTER — Encounter: Payer: Self-pay | Admitting: Family Medicine

## 2015-10-14 VITALS — BP 113/78 | HR 54 | Temp 97.7°F | Resp 20 | Wt 256.0 lb

## 2015-10-14 DIAGNOSIS — R05 Cough: Secondary | ICD-10-CM | POA: Diagnosis not present

## 2015-10-14 DIAGNOSIS — K219 Gastro-esophageal reflux disease without esophagitis: Secondary | ICD-10-CM

## 2015-10-14 DIAGNOSIS — Z889 Allergy status to unspecified drugs, medicaments and biological substances status: Secondary | ICD-10-CM

## 2015-10-14 DIAGNOSIS — E8881 Metabolic syndrome: Secondary | ICD-10-CM | POA: Diagnosis not present

## 2015-10-14 DIAGNOSIS — R059 Cough, unspecified: Secondary | ICD-10-CM

## 2015-10-14 DIAGNOSIS — Z9109 Other allergy status, other than to drugs and biological substances: Secondary | ICD-10-CM | POA: Diagnosis not present

## 2015-10-14 LAB — LIPID PANEL
CHOL/HDL RATIO: 7
Cholesterol: 259 mg/dL — ABNORMAL HIGH (ref 0–200)
HDL: 35.5 mg/dL — ABNORMAL LOW (ref >39.00)
LDL Cholesterol: 187 mg/dL — ABNORMAL HIGH (ref 0–99)
NONHDL: 223.34
Triglycerides: 183 mg/dL — ABNORMAL HIGH (ref 0.0–149.0)
VLDL: 36.6 mg/dL (ref 0.0–40.0)

## 2015-10-14 MED ORDER — PREDNISONE 20 MG PO TABS: ORAL_TABLET | ORAL | Status: DC

## 2015-10-14 MED ORDER — OMEPRAZOLE 40 MG PO CPDR: 40.0000 mg | DELAYED_RELEASE_CAPSULE | Freq: Every day | ORAL | Status: DC

## 2015-10-14 MED ORDER — ALBUTEROL SULFATE HFA 108 (90 BASE) MCG/ACT IN AERS: 2.0000 | INHALATION_SPRAY | RESPIRATORY_TRACT | Status: DC | PRN

## 2015-10-14 MED ORDER — AZELASTINE HCL 0.1 % NA SOLN: 2.0000 | Freq: Two times a day (BID) | NASAL | Status: DC

## 2015-10-14 NOTE — Patient Instructions (Signed)
Take either zyrtec or allegra daily (antihistamine) for at least 30 days.  I have called in a prednisone taper as well to try to decrease swelling.  Re-start PPI nad follow GERD diet. This has also been called in.    Food Choices for Gastroesophageal Reflux Disease, Adult When you have gastroesophageal reflux disease (GERD), the foods you eat and your eating habits are very important. Choosing the right foods can help ease the discomfort of GERD. WHAT GENERAL GUIDELINES DO I NEED TO FOLLOW?  Choose fruits, vegetables, whole grains, low-fat dairy products, and low-fat meat, fish, and poultry.  Limit fats such as oils, salad dressings, butter, nuts, and avocado.  Keep a food diary to identify foods that cause symptoms.  Avoid foods that cause reflux. These may be different for different people.  Eat frequent small meals instead of three large meals each day.  Eat your meals slowly, in a relaxed setting.  Limit fried foods.  Cook foods using methods other than frying.  Avoid drinking alcohol.  Avoid drinking large amounts of liquids with your meals.  Avoid bending over or lying down until 2-3 hours after eating. WHAT FOODS ARE NOT RECOMMENDED? The following are some foods and drinks that may worsen your symptoms: Vegetables Tomatoes. Tomato juice. Tomato and spaghetti sauce. Chili peppers. Onion and garlic. Horseradish. Fruits Oranges, grapefruit, and lemon (fruit and juice). Meats High-fat meats, fish, and poultry. This includes hot dogs, ribs, ham, sausage, salami, and bacon. Dairy Whole milk and chocolate milk. Sour cream. Cream. Butter. Ice cream. Cream cheese.  Beverages Coffee and tea, with or without caffeine. Carbonated beverages or energy drinks. Condiments Hot sauce. Barbecue sauce.  Sweets/Desserts Chocolate and cocoa. Donuts. Peppermint and spearmint. Fats and Oils High-fat foods, including [place] fries and potato chips. Other Vinegar. Strong spices, such as  black pepper, white pepper, red pepper, cayenne, curry powder, cloves, ginger, and chili powder. The items listed above may not be a complete list of foods and beverages to avoid. Contact your dietitian for more information.   This information is not intended to replace advice given to you by your health care provider. Make sure you discuss any questions you have with your health care provider.   Document Released: 10/22/2005 Document Revised: 11/12/2014 Document Reviewed: 08/26/2013 Elsevier Interactive Patient Education [employer].
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
## 2015-10-17 ENCOUNTER — Telehealth: Payer: Self-pay | Admitting: Family Medicine

## 2015-10-17 MED ORDER — ROSUVASTATIN CALCIUM 20 MG PO TABS: 20.0000 mg | ORAL_TABLET | Freq: Every day | ORAL | Status: DC

## 2015-10-17 NOTE — Telephone Encounter (Signed)
Medication called in to his pharmacy ([facility]).

## 2015-10-17 NOTE — Telephone Encounter (Signed)
Please call pt: - his cholesterol is elevated. During his last appt he stated he had stopped taking the Lipitor and was taking supplement only. It is my recommendation he start back on the Lipitor or another statin for cardiovascular protection. I am uncertain the reasoning for him stopping the Lipitor. If it was a reaction of some type we can try another statin.  Pleas advise and I will call in a script for him.   Results for orders placed or performed in visit on 10/14/15 (from the past 72 hour(s))  Lipid panel     Status: Abnormal   Collection Time: 10/14/15 11:19 AM  Result Value Ref Range   Cholesterol 259 (H) 0 - 200 mg/dL    Comment: ATP III Classification       Desirable:  < 200 mg/dL               Borderline High:  200 - 239 mg/dL          High:  > = 240 mg/dL   Triglycerides 183.0 (H) 0.0 - 149.0 mg/dL    Comment: Normal:  <150 mg/dLBorderline High:  150 - 199 mg/dL   HDL 35.50 (L) >39.00 mg/dL   VLDL 36.6 0.0 - 40.0 mg/dL   LDL Cholesterol 187 (H) 0 - 99 mg/dL   Total CHOL/HDL Ratio 7     Comment:                Men          Women1/2 Average Risk     3.4          3.3Average Risk          5.0          4.42X Average Risk          9.6          7.13X Average Risk          15.0          11.0                       NonHDL 223.34     Comment: NOTE:  Non-HDL goal should be 30 mg/dL higher than patient's LDL goal (i.e. LDL goal of < 70 mg/dL, would have non-HDL goal of < 100 mg/dL)

## 2015-10-17 NOTE — Telephone Encounter (Signed)
Spoke with patient reviewed lab results. Patient willing to try another cholesterol medication states atorvastatin not covered by his insurance and was not working.

## 2015-10-24 ENCOUNTER — Ambulatory Visit (INDEPENDENT_AMBULATORY_CARE_PROVIDER_SITE_OTHER): Payer: Federal, State, Local not specified - PPO | Admitting: Family Medicine

## 2015-10-24 ENCOUNTER — Encounter: Payer: Self-pay | Admitting: Family Medicine

## 2015-10-24 ENCOUNTER — Other Ambulatory Visit (INDEPENDENT_AMBULATORY_CARE_PROVIDER_SITE_OTHER): Payer: Federal, State, Local not specified - PPO

## 2015-10-24 VITALS — BP 118/82 | HR 82 | Ht 75.0 in | Wt 258.0 lb

## 2015-10-24 DIAGNOSIS — M6740 Ganglion, unspecified site: Secondary | ICD-10-CM | POA: Diagnosis not present

## 2015-10-24 DIAGNOSIS — M25561 Pain in right knee: Secondary | ICD-10-CM | POA: Diagnosis not present

## 2015-10-24 NOTE — Progress Notes (Signed)
[name] [address] [place], [place] [address] Phone: [phone] Subjective:    I'm seeing this patient by the request  of:  MCGOWEN,PHILIP H, MD   CC: Knee swelling, right  [id number] Jacob James is a 49 y.o. male coming in with complaint of knee swelling. Patient was seen previously and was diagnosed with a fibular head ganglion cyst. Was seen 9 months ago and did have aspiration of the cyst. Patient states started to reaccumulate and over the last 2 months seems to be worsening. Severely giving him now pain with any type of ambulation or even at rest. Seems to be better than last time. Denies any fever, chills, any abnormal weight loss.      Past medical history, social, surgical and family history all reviewed in electronic medical record.   Review of Systems: No headache, visual changes, nausea, vomiting, diarrhea, constipation, dizziness, abdominal pain, skin rash, fevers, chills, night sweats, weight loss, swollen lymph nodes, body aches, joint swelling, muscle aches, chest pain, shortness of breath, mood changes.   Objective Blood pressure 118/82, pulse 82, height 6\' 3"  (1.905 m), weight 258 lb (117.028 kg), SpO2 95 %.  General: No apparent distress alert and oriented x3 mood and affect normal, dressed appropriately.  HEENT: Pupils equal, extraocular movements intact  Respiratory: Patient's speak in full sentences and does not appear short of breath  Cardiovascular: No lower extremity edema, non tender, no erythema  Skin: Warm dry intact with no signs of infection or rash on extremities or on axial skeleton.  Abdomen: Soft nontender  Neuro: Cranial nerves II through XII are intact, neurovascularly intact in all extremities with 2+ DTRs and 2+ pulses.  Lymph: No lymphadenopathy of posterior or anterior cervical chain or axillae bilaterally.  Gait normal with good balance and coordination.  MSK:  Non tender with full range of motion and  good stability and symmetric strength and tone of shoulders, elbows, wrist, hip, and ankles bilaterally.   Knee: right On inspection patient does have what appears to be a cyst formation on the fibula head. Larger than last exam. Patient is tender over the cyst itself. ROM full in flexion and extension and lower leg rotation. Ligaments with solid consistent endpoints including ACL, PCL, LCL, MCL. Negative Mcmurray's, Apley's, and Thessalonian tests. Non painful patellar compression. Patellar glide without crepitus. Patellar and quadriceps tendons unremarkable. Hamstring and quadriceps strength is normal.  Contralateral knee unremarkable. Patient's feet are in supination.  MSK US performed of: Right knee This study was ordered, performed, and interpreted by Charlann Boxer D.O.  Knee: All structures visualized. Anteromedial, anterolateral, posteromedial, and posterolateral menisci unremarkable without tearing, fraying, effusion, or displacement. Her patient's fibular head there appears to be a cystic structure with s likely represents a ganglion cyst recurrent and a little larger than previous exam. Tender to palpation. No signs of infection. Patellar Tendon unremarkable on long and transverse views without effusion. No abnormality of prepatellar bursa. LCL and MCL unremarkable on long and transverse views. No abnormality of origin of medial or lateral head of the gastrocnemius.  IMPRESSION:  Fibular head ganglion cyst septated, larger than last exam.   Procedure note After verbal consent patient was prepped with alcohol swabs and a 27-gauge 1-1/2 inch needle was injected into the ganglion cyst under ultrasound guidance. Patient did have 1 cc of 0.5% Marcaine injected. Patient then had significant amount of ganglion cyst material removed. Again then  1 cc of Kenalog injected, post injection  instructions given.       Impression and Recommendations:     This case required medical  decision making of moderate complexity.

## 2015-10-24 NOTE — Progress Notes (Signed)
Pre visit review using our clinic review tool, if applicable. No additional management support is needed unless otherwise documented below in the visit note. 

## 2015-10-24 NOTE — Patient Instructions (Signed)
Good to see you Drained it again.  New exercises for your hamstring when you feel it.  Stay active Will see me again when you need me or call me if anything changes

## 2015-10-24 NOTE — Assessment & Plan Note (Signed)
Patient an aspiration again done today. We discussed home exercises and icing. We discussed compression. Patient will try conservative therapy at this time. We will discuss is continuing to give him difficulty we may need to consider surgical removal. We discussed further workup has mild to see how deep this goes at a later date if necessary.  Spent  25 minutes with patient face-to-face and had greater than 50% of counseling including as described above in assessment and plan.

## 2015-11-01 ENCOUNTER — Ambulatory Visit (INDEPENDENT_AMBULATORY_CARE_PROVIDER_SITE_OTHER): Payer: Federal, State, Local not specified - PPO | Admitting: Family Medicine

## 2015-11-01 ENCOUNTER — Encounter: Payer: Self-pay | Admitting: Family Medicine

## 2015-11-01 ENCOUNTER — Ambulatory Visit: Payer: Federal, State, Local not specified - PPO | Admitting: Family Medicine

## 2015-11-01 VITALS — BP 129/88 | HR 83 | Temp 98.1°F | Resp 20 | Wt 254.0 lb

## 2015-11-01 DIAGNOSIS — J0110 Acute frontal sinusitis, unspecified: Secondary | ICD-10-CM

## 2015-11-01 DIAGNOSIS — J321 Chronic frontal sinusitis: Secondary | ICD-10-CM | POA: Insufficient documentation

## 2015-11-01 MED ORDER — FLUTICASONE PROPIONATE 50 MCG/ACT NA SUSP: 2.0000 | Freq: Every day | NASAL | Status: DC

## 2015-11-01 MED ORDER — AMOXICILLIN-POT CLAVULANATE 875-125 MG PO TABS: 1.0000 | ORAL_TABLET | Freq: Two times a day (BID) | ORAL | Status: DC

## 2015-11-01 MED ORDER — PREDNISONE 50 MG PO TABS: ORAL_TABLET | ORAL | Status: DC

## 2015-11-01 NOTE — Patient Instructions (Signed)
Sinusitis, Adult Sinusitis is redness, soreness, and inflammation of the paranasal sinuses. Paranasal sinuses are air pockets within the bones of your face. They are located beneath your eyes, in the middle of your forehead, and above your eyes. In healthy paranasal sinuses, mucus is able to drain out, and air is able to circulate through them by way of your nose. However, when your paranasal sinuses are inflamed, mucus and air can become trapped. This can allow bacteria and other germs to grow and cause infection. Sinusitis can develop quickly and last only a short time (acute) or continue over a long period (chronic). Sinusitis that lasts for more than 12 weeks is considered chronic. CAUSES Causes of sinusitis include:  Allergies.  Structural abnormalities, such as displacement of the cartilage that separates your nostrils (deviated septum), which can decrease the air flow through your nose and sinuses and affect sinus drainage.  Functional abnormalities, such as when the small hairs (cilia) that line your sinuses and help remove mucus do not work properly or are not present. SIGNS AND SYMPTOMS Symptoms of acute and chronic sinusitis are the same. The primary symptoms are pain and pressure around the affected sinuses. Other symptoms include:  Upper toothache.  Earache.  Headache.  Bad breath.  Decreased sense of smell and taste.  A cough, which worsens when you are lying flat.  Fatigue.  Fever.  Thick drainage from your nose, which often is green and may contain pus (purulent).  Swelling and warmth over the affected sinuses. DIAGNOSIS Your health care provider will perform a physical exam. During your exam, your health care provider may perform any of the following to help determine if you have acute sinusitis or chronic sinusitis:  Look in your nose for signs of abnormal growths in your nostrils (nasal polyps).  Tap over the affected sinus to check for signs of  infection.  View the inside of your sinuses using an imaging device that has a light attached (endoscope). If your health care provider suspects that you have chronic sinusitis, one or more of the following tests may be recommended:  Allergy tests.  Nasal culture. A sample of mucus is taken from your nose, sent to a lab, and screened for bacteria.  Nasal cytology. A sample of mucus is taken from your nose and examined by your health care provider to determine if your sinusitis is related to an allergy. TREATMENT Most cases of acute sinusitis are related to a viral infection and will resolve on their own within 10 days. Sometimes, medicines are prescribed to help relieve symptoms of both acute and chronic sinusitis. These may include pain medicines, decongestants, nasal steroid sprays, or saline sprays. However, for sinusitis related to a bacterial infection, your health care provider will prescribe antibiotic medicines. These are medicines that will help kill the bacteria causing the infection. Rarely, sinusitis is caused by a fungal infection. In these cases, your health care provider will prescribe antifungal medicine. For some cases of chronic sinusitis, surgery is needed. Generally, these are cases in which sinusitis recurs more than 3 times per year, despite other treatments. HOME CARE INSTRUCTIONS  Drink plenty of water. Water helps thin the mucus so your sinuses can drain more easily.  Use a humidifier.  Inhale steam 3-4 times a day (for example, sit in the bathroom with the shower running).  Apply a warm, moist washcloth to your face 3-4 times a day, or as directed by your health care provider.  Use saline nasal sprays to help   moisten and clean your sinuses.  Take medicines only as directed by your health care provider.  If you were prescribed either an antibiotic or antifungal medicine, finish it all even if you start to feel better. SEEK IMMEDIATE MEDICAL CARE IF:  You have  increasing pain or severe headaches.  You have nausea, vomiting, or drowsiness.  You have swelling around your face.  You have vision problems.  You have a stiff neck.  You have difficulty breathing.   This information is not intended to replace advice given to you by your health care provider. Make sure you discuss any questions you have with your health care provider.   Document Released: 10/22/2005 Document Revised: 11/12/2014 Document Reviewed: 11/06/2011 Elsevier Interactive Patient Education 2016 [employer]  augmentin /mucinex/floanse/prednisone. Use a humidifier.
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
## 2015-11-02 ENCOUNTER — Ambulatory Visit: Payer: Federal, State, Local not specified - PPO | Admitting: Family Medicine

## 2015-11-06 DIAGNOSIS — R7303 Prediabetes: Secondary | ICD-10-CM

## 2015-11-06 DIAGNOSIS — M47816 Spondylosis without myelopathy or radiculopathy, lumbar region: Secondary | ICD-10-CM

## 2015-11-06 DIAGNOSIS — G4733 Obstructive sleep apnea (adult) (pediatric): Secondary | ICD-10-CM

## 2015-11-06 HISTORY — DX: Spondylosis without myelopathy or radiculopathy, lumbar region: M47.816

## 2015-11-06 HISTORY — DX: Prediabetes: R73.03

## 2015-11-06 HISTORY — DX: Obstructive sleep apnea (adult) (pediatric): G47.33

## 2015-11-18 ENCOUNTER — Encounter: Payer: Self-pay | Admitting: Family Medicine

## 2015-11-18 HISTORY — PX: COLONOSCOPY: SHX174

## 2015-11-18 LAB — HM COLONOSCOPY

## 2016-02-21 DIAGNOSIS — M71572 Other bursitis, not elsewhere classified, left ankle and foot: Secondary | ICD-10-CM | POA: Diagnosis not present

## 2016-02-21 DIAGNOSIS — M722 Plantar fascial fibromatosis: Secondary | ICD-10-CM | POA: Diagnosis not present

## 2016-02-21 DIAGNOSIS — M71571 Other bursitis, not elsewhere classified, right ankle and foot: Secondary | ICD-10-CM | POA: Diagnosis not present

## 2016-03-07 ENCOUNTER — Ambulatory Visit (INDEPENDENT_AMBULATORY_CARE_PROVIDER_SITE_OTHER)
Admission: RE | Admit: 2016-03-07 | Discharge: 2016-03-07 | Disposition: A | Discharge status: Home / Self Care | Payer: Federal, State, Local not specified - PPO | Source: Ambulatory Visit | Attending: Family Medicine | Admitting: Family Medicine

## 2016-03-07 ENCOUNTER — Encounter: Payer: Self-pay | Admitting: Family Medicine

## 2016-03-07 ENCOUNTER — Other Ambulatory Visit (INDEPENDENT_AMBULATORY_CARE_PROVIDER_SITE_OTHER): Payer: Federal, State, Local not specified - PPO

## 2016-03-07 ENCOUNTER — Ambulatory Visit (INDEPENDENT_AMBULATORY_CARE_PROVIDER_SITE_OTHER): Payer: Federal, State, Local not specified - PPO | Admitting: Family Medicine

## 2016-03-07 VITALS — BP 138/82 | HR 76 | Wt 263.0 lb

## 2016-03-07 DIAGNOSIS — R200 Anesthesia of skin: Secondary | ICD-10-CM

## 2016-03-07 DIAGNOSIS — M5416 Radiculopathy, lumbar region: Secondary | ICD-10-CM

## 2016-03-07 DIAGNOSIS — R202 Paresthesia of skin: Secondary | ICD-10-CM | POA: Diagnosis not present

## 2016-03-07 DIAGNOSIS — R209 Unspecified disturbances of skin sensation: Secondary | ICD-10-CM | POA: Diagnosis not present

## 2016-03-07 LAB — CBC WITH DIFFERENTIAL/PLATELET
BASOS ABS: 0.1 10*3/uL (ref 0.0–0.1)
Basophils Relative: 0.4 % (ref 0.0–3.0)
EOS ABS: 0.1 10*3/uL (ref 0.0–0.7)
Eosinophils Relative: 0.9 % (ref 0.0–5.0)
HEMATOCRIT: 43 % (ref 39.0–52.0)
HEMOGLOBIN: 14.6 g/dL (ref 13.0–17.0)
LYMPHS PCT: 19.4 % (ref 12.0–46.0)
Lymphs Abs: 2.5 10*3/uL (ref 0.7–4.0)
MCHC: 33.9 g/dL (ref 30.0–36.0)
MCV: 80.5 fl (ref 78.0–100.0)
MONO ABS: 1 10*3/uL (ref 0.1–1.0)
MONOS PCT: 7.5 % (ref 3.0–12.0)
Neutro Abs: 9.3 10*3/uL — ABNORMAL HIGH (ref 1.4–7.7)
Neutrophils Relative %: 71.8 % (ref 43.0–77.0)
Platelets: 332 10*3/uL (ref 150.0–400.0)
RBC: 5.34 Mil/uL (ref 4.22–5.81)
RDW: 14.7 % (ref 11.5–15.5)
WBC: 12.9 10*3/uL — ABNORMAL HIGH (ref 4.0–10.5)

## 2016-03-07 LAB — TSH: TSH: 1.29 u[IU]/mL (ref 0.35–4.50)

## 2016-03-07 LAB — COMPREHENSIVE METABOLIC PANEL
ALBUMIN: 4.5 g/dL (ref 3.5–5.2)
ALT: 19 U/L (ref 0–53)
AST: 13 U/L (ref 0–37)
Alkaline Phosphatase: 49 U/L (ref 39–117)
BILIRUBIN TOTAL: 0.5 mg/dL (ref 0.2–1.2)
BUN: 13 mg/dL (ref 6–23)
CALCIUM: 9.4 mg/dL (ref 8.4–10.5)
CHLORIDE: 103 meq/L (ref 96–112)
CO2: 28 mEq/L (ref 19–32)
CREATININE: 1.09 mg/dL (ref 0.40–1.50)
GFR: 75.6 mL/min (ref >60.00)
Glucose, Bld: 105 mg/dL — ABNORMAL HIGH (ref 70–99)
Potassium: 4.3 mEq/L (ref 3.5–5.1)
SODIUM: 139 meq/L (ref 135–145)
TOTAL PROTEIN: 7.6 g/dL (ref 6.0–8.3)

## 2016-03-07 LAB — SEDIMENTATION RATE: SED RATE: 10 mm/h (ref 0–22)

## 2016-03-07 LAB — C-REACTIVE PROTEIN: CRP: 1 mg/dL (ref 0.5–20.0)

## 2016-03-07 MED ORDER — PREDNISONE 50 MG PO TABS: 50.0000 mg | ORAL_TABLET | Freq: Every day | ORAL | Status: DC

## 2016-03-07 MED ORDER — GABAPENTIN 100 MG PO CAPS: 200.0000 mg | ORAL_CAPSULE | Freq: Every day | ORAL | Status: DC

## 2016-03-07 NOTE — Patient Instructions (Addendum)
Good to see you  I think it is from your back  Xray and labs downstairs.  Prednisone daily for 5 days  Gabapentin 300mg  at night to help with the nerve.  If worsening symptoms or bowel or bladder symptoms please go to ER See me again in 1 week. (OK to Double book)
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
## 2016-03-07 NOTE — Assessment & Plan Note (Signed)
I believe the patient is having more of a lumbar radiculopathy. Patient though seems to be coming more difficulty than what is found on exam. Patient states that he has this constant numbness and we will get labs to further evaluate. These will be drawn today. Hin addition to this patient is also having weakness of the legsBut more on the right side. Patient's also deep tendon reflexes seem to be decrease. I do believe that an MRI of the lumbar spine is necessary. X-rays ordered today to rule out any other bony abnormality that can be contribute in. That would like to move forward with advance imaging secondary to the weakness case this could be irreversible if we tingling. Patient given prednisone and gabapentin to alleviate some of his symptoms. Follow-up with me 1-2 days after the MRI to discuss further evaluation. Patient knows if any worsening symptoms to seek medical attention immediately.  Spent  25 minutes with patient face-to-face and had greater than 50% of counseling including as described above in assessment and plan.

## 2016-03-08 LAB — ANA: Anti Nuclear Antibody(ANA): NEGATIVE

## 2016-03-09 ENCOUNTER — Ambulatory Visit
Admission: RE | Admit: 2016-03-09 | Discharge: 2016-03-09 | Disposition: A | Discharge status: Home / Self Care | Payer: Federal, State, Local not specified - PPO | Source: Ambulatory Visit | Attending: Family Medicine | Admitting: Family Medicine

## 2016-03-09 ENCOUNTER — Encounter: Payer: Self-pay | Admitting: Family Medicine

## 2016-03-09 DIAGNOSIS — M5126 Other intervertebral disc displacement, lumbar region: Secondary | ICD-10-CM | POA: Diagnosis not present

## 2016-03-09 DIAGNOSIS — R202 Paresthesia of skin: Principal | ICD-10-CM

## 2016-03-09 DIAGNOSIS — R200 Anesthesia of skin: Secondary | ICD-10-CM

## 2016-03-11 ENCOUNTER — Encounter: Payer: Self-pay | Admitting: Family Medicine

## 2016-04-19 DIAGNOSIS — R251 Tremor, unspecified: Secondary | ICD-10-CM | POA: Diagnosis not present

## 2016-04-19 DIAGNOSIS — M5030 Other cervical disc degeneration, unspecified cervical region: Secondary | ICD-10-CM | POA: Diagnosis not present

## 2016-04-19 DIAGNOSIS — M549 Dorsalgia, unspecified: Secondary | ICD-10-CM | POA: Diagnosis not present

## 2016-04-19 DIAGNOSIS — M542 Cervicalgia: Secondary | ICD-10-CM | POA: Diagnosis not present

## 2016-04-19 DIAGNOSIS — M545 Low back pain: Secondary | ICD-10-CM | POA: Diagnosis not present

## 2016-04-19 DIAGNOSIS — G478 Other sleep disorders: Secondary | ICD-10-CM | POA: Diagnosis not present

## 2016-04-19 DIAGNOSIS — Z0189 Encounter for other specified special examinations: Secondary | ICD-10-CM | POA: Diagnosis not present

## 2016-05-18 ENCOUNTER — Other Ambulatory Visit: Payer: Self-pay | Admitting: *Deleted

## 2016-05-18 DIAGNOSIS — M5416 Radiculopathy, lumbar region: Secondary | ICD-10-CM

## 2016-05-20 ENCOUNTER — Encounter: Payer: Self-pay | Admitting: Family Medicine

## 2016-05-23 DIAGNOSIS — G479 Sleep disorder, unspecified: Secondary | ICD-10-CM | POA: Diagnosis not present

## 2016-05-23 DIAGNOSIS — R202 Paresthesia of skin: Secondary | ICD-10-CM | POA: Diagnosis not present

## 2016-05-30 DIAGNOSIS — M545 Low back pain: Secondary | ICD-10-CM | POA: Diagnosis not present

## 2016-05-30 DIAGNOSIS — M6281 Muscle weakness (generalized): Secondary | ICD-10-CM | POA: Diagnosis not present

## 2016-05-30 DIAGNOSIS — R202 Paresthesia of skin: Secondary | ICD-10-CM | POA: Diagnosis not present

## 2016-06-01 ENCOUNTER — Other Ambulatory Visit: Payer: Self-pay | Admitting: *Deleted

## 2016-06-01 MED ORDER — ROSUVASTATIN CALCIUM 20 MG PO TABS: 20.0000 mg | ORAL_TABLET | Freq: Every day | ORAL | 0 refills | Status: DC

## 2016-06-01 NOTE — Telephone Encounter (Signed)
RF request for rosuvastatin LOV: 10/14/15 Next ov: None Last written: 10/17/15 #30 w/ 5RF  Rx sent for #30 w/ 0RF. Pt needs office visit for more refills.

## 2016-06-04 NOTE — Telephone Encounter (Signed)
Left message for pt to call back  °

## 2016-06-12 NOTE — Telephone Encounter (Signed)
Left message for pt to call back  °

## 2016-06-15 NOTE — Telephone Encounter (Signed)
Pt has made an apt on 06/27/16 at 11:30am does he need to be fasting. His is concerned that the cholesterol medication is not strong enough. Please advise. Thanks.

## 2016-06-19 DIAGNOSIS — R030 Elevated blood-pressure reading, without diagnosis of hypertension: Secondary | ICD-10-CM | POA: Diagnosis not present

## 2016-06-19 DIAGNOSIS — M4310 Spondylolisthesis, site unspecified: Secondary | ICD-10-CM | POA: Diagnosis not present

## 2016-06-19 DIAGNOSIS — Z6833 Body mass index (BMI) 33.0-33.9, adult: Secondary | ICD-10-CM | POA: Diagnosis not present

## 2016-06-27 ENCOUNTER — Encounter: Payer: Self-pay | Admitting: Family Medicine

## 2016-06-27 ENCOUNTER — Ambulatory Visit (INDEPENDENT_AMBULATORY_CARE_PROVIDER_SITE_OTHER): Payer: Federal, State, Local not specified - PPO | Admitting: Family Medicine

## 2016-06-27 VITALS — BP 132/88 | HR 77 | Temp 98.0°F | Resp 16 | Ht 74.0 in | Wt 264.5 lb

## 2016-06-27 DIAGNOSIS — E785 Hyperlipidemia, unspecified: Secondary | ICD-10-CM | POA: Diagnosis not present

## 2016-06-27 DIAGNOSIS — E8881 Metabolic syndrome: Secondary | ICD-10-CM

## 2016-06-27 DIAGNOSIS — G4733 Obstructive sleep apnea (adult) (pediatric): Secondary | ICD-10-CM

## 2016-06-27 DIAGNOSIS — M4725 Other spondylosis with radiculopathy, thoracolumbar region: Secondary | ICD-10-CM

## 2016-06-27 NOTE — Progress Notes (Signed)
Pre visit review using our clinic review tool, if applicable. No additional management support is needed unless otherwise documented below in the visit note. 
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
## 2016-07-02 ENCOUNTER — Encounter: Payer: Self-pay | Admitting: Family Medicine

## 2016-07-02 ENCOUNTER — Other Ambulatory Visit (INDEPENDENT_AMBULATORY_CARE_PROVIDER_SITE_OTHER): Payer: Federal, State, Local not specified - PPO

## 2016-07-02 DIAGNOSIS — E8881 Metabolic syndrome: Secondary | ICD-10-CM | POA: Diagnosis not present

## 2016-07-02 LAB — LIPID PANEL
CHOL/HDL RATIO: 6
Cholesterol: 198 mg/dL (ref 0–200)
HDL: 31.4 mg/dL — ABNORMAL LOW (ref >39.00)
LDL CALC: 131 mg/dL — AB (ref 0–99)
NONHDL: 166.42
TRIGLYCERIDES: 178 mg/dL — AB (ref 0.0–149.0)
VLDL: 35.6 mg/dL (ref 0.0–40.0)

## 2016-07-02 LAB — COMPREHENSIVE METABOLIC PANEL
ALK PHOS: 46 U/L (ref 39–117)
ALT: 24 U/L (ref 0–53)
AST: 17 U/L (ref 0–37)
Albumin: 4.1 g/dL (ref 3.5–5.2)
BILIRUBIN TOTAL: 0.4 mg/dL (ref 0.2–1.2)
BUN: 15 mg/dL (ref 6–23)
CALCIUM: 8.9 mg/dL (ref 8.4–10.5)
CHLORIDE: 105 meq/L (ref 96–112)
CO2: 29 meq/L (ref 19–32)
CREATININE: 1 mg/dL (ref 0.40–1.50)
GFR: 83.4 mL/min (ref >60.00)
Glucose, Bld: 106 mg/dL — ABNORMAL HIGH (ref 70–99)
Potassium: 4.2 mEq/L (ref 3.5–5.1)
Sodium: 140 mEq/L (ref 135–145)
Total Protein: 7 g/dL (ref 6.0–8.3)

## 2016-07-03 ENCOUNTER — Other Ambulatory Visit: Payer: Self-pay | Admitting: Family Medicine

## 2016-07-03 DIAGNOSIS — M5416 Radiculopathy, lumbar region: Secondary | ICD-10-CM | POA: Diagnosis not present

## 2016-07-03 DIAGNOSIS — R200 Anesthesia of skin: Secondary | ICD-10-CM | POA: Diagnosis not present

## 2016-07-03 DIAGNOSIS — M47816 Spondylosis without myelopathy or radiculopathy, lumbar region: Secondary | ICD-10-CM | POA: Diagnosis not present

## 2016-07-03 DIAGNOSIS — M4310 Spondylolisthesis, site unspecified: Secondary | ICD-10-CM | POA: Diagnosis not present

## 2016-07-03 MED ORDER — ROSUVASTATIN CALCIUM 20 MG PO TABS: 20.0000 mg | ORAL_TABLET | Freq: Every day | ORAL | 3 refills | Status: DC

## 2016-07-04 ENCOUNTER — Other Ambulatory Visit: Payer: Self-pay

## 2016-07-04 ENCOUNTER — Telehealth: Payer: Self-pay | Admitting: Family Medicine

## 2016-07-04 DIAGNOSIS — G629 Polyneuropathy, unspecified: Secondary | ICD-10-CM | POA: Diagnosis not present

## 2016-07-04 DIAGNOSIS — Z8719 Personal history of other diseases of the digestive system: Secondary | ICD-10-CM

## 2016-07-04 DIAGNOSIS — M5416 Radiculopathy, lumbar region: Secondary | ICD-10-CM | POA: Diagnosis not present

## 2016-07-04 MED ORDER — OMEPRAZOLE 40 MG PO CPDR: 40.0000 mg | DELAYED_RELEASE_CAPSULE | Freq: Every day | ORAL | 6 refills | Status: DC

## 2016-07-04 NOTE — Telephone Encounter (Signed)
Please just let me know when you receive OV notes from [facility].

## 2016-07-04 NOTE — Telephone Encounter (Signed)
Patient saw Dr. Brien Few at [facility] today. Patient gave the physician your name as PCP. Please contact patient after OV notes are received from specialist as that specialist advised patient to follow up with primary care physician.

## 2016-07-06 DIAGNOSIS — M5416 Radiculopathy, lumbar region: Secondary | ICD-10-CM | POA: Diagnosis not present

## 2016-07-20 DIAGNOSIS — G4489 Other headache syndrome: Secondary | ICD-10-CM | POA: Diagnosis not present

## 2016-07-20 DIAGNOSIS — M545 Low back pain: Secondary | ICD-10-CM | POA: Diagnosis not present

## 2016-07-20 DIAGNOSIS — Z1329 Encounter for screening for other suspected endocrine disorder: Secondary | ICD-10-CM | POA: Diagnosis not present

## 2016-07-20 DIAGNOSIS — M542 Cervicalgia: Secondary | ICD-10-CM | POA: Diagnosis not present

## 2016-07-20 DIAGNOSIS — G4733 Obstructive sleep apnea (adult) (pediatric): Secondary | ICD-10-CM | POA: Diagnosis not present

## 2016-07-24 DIAGNOSIS — M4712 Other spondylosis with myelopathy, cervical region: Secondary | ICD-10-CM | POA: Diagnosis not present

## 2016-07-24 DIAGNOSIS — M4802 Spinal stenosis, cervical region: Secondary | ICD-10-CM | POA: Diagnosis not present

## 2016-07-24 DIAGNOSIS — M4312 Spondylolisthesis, cervical region: Secondary | ICD-10-CM | POA: Diagnosis not present

## 2016-07-31 DIAGNOSIS — M5124 Other intervertebral disc displacement, thoracic region: Secondary | ICD-10-CM | POA: Diagnosis not present

## 2016-08-29 DIAGNOSIS — H524 Presbyopia: Secondary | ICD-10-CM | POA: Diagnosis not present

## 2016-08-29 DIAGNOSIS — H04123 Dry eye syndrome of bilateral lacrimal glands: Secondary | ICD-10-CM | POA: Diagnosis not present

## 2016-08-29 DIAGNOSIS — Z9889 Other specified postprocedural states: Secondary | ICD-10-CM | POA: Diagnosis not present

## 2016-09-05 DIAGNOSIS — R202 Paresthesia of skin: Secondary | ICD-10-CM

## 2016-09-05 HISTORY — DX: Paresthesia of skin: R20.2

## 2016-09-11 ENCOUNTER — Encounter: Payer: Self-pay | Admitting: Neurology

## 2016-09-11 ENCOUNTER — Telehealth: Payer: Self-pay | Admitting: Family Medicine

## 2016-09-11 ENCOUNTER — Encounter: Payer: Self-pay | Admitting: Family Medicine

## 2016-09-11 ENCOUNTER — Ambulatory Visit (INDEPENDENT_AMBULATORY_CARE_PROVIDER_SITE_OTHER): Payer: Federal, State, Local not specified - PPO | Admitting: Family Medicine

## 2016-09-11 ENCOUNTER — Ambulatory Visit (INDEPENDENT_AMBULATORY_CARE_PROVIDER_SITE_OTHER): Payer: Federal, State, Local not specified - PPO | Admitting: Neurology

## 2016-09-11 VITALS — BP 142/88 | HR 65 | Temp 98.4°F | Resp 16 | Wt 266.0 lb

## 2016-09-11 DIAGNOSIS — R7301 Impaired fasting glucose: Secondary | ICD-10-CM | POA: Diagnosis not present

## 2016-09-11 DIAGNOSIS — M545 Low back pain, unspecified: Secondary | ICD-10-CM

## 2016-09-11 DIAGNOSIS — R202 Paresthesia of skin: Secondary | ICD-10-CM

## 2016-09-11 DIAGNOSIS — G8929 Other chronic pain: Secondary | ICD-10-CM | POA: Diagnosis not present

## 2016-09-11 DIAGNOSIS — M792 Neuralgia and neuritis, unspecified: Secondary | ICD-10-CM | POA: Diagnosis not present

## 2016-09-11 DIAGNOSIS — M79601 Pain in right arm: Secondary | ICD-10-CM

## 2016-09-11 DIAGNOSIS — M791 Myalgia, unspecified site: Secondary | ICD-10-CM

## 2016-09-11 DIAGNOSIS — M79602 Pain in left arm: Secondary | ICD-10-CM

## 2016-09-11 LAB — COMPREHENSIVE METABOLIC PANEL
ALK PHOS: 49 U/L (ref 39–117)
ALT: 20 U/L (ref 0–53)
AST: 13 U/L (ref 0–37)
Albumin: 4.6 g/dL (ref 3.5–5.2)
BUN: 16 mg/dL (ref 6–23)
CHLORIDE: 102 meq/L (ref 96–112)
CO2: 30 meq/L (ref 19–32)
Calcium: 9.9 mg/dL (ref 8.4–10.5)
Creatinine, Ser: 0.96 mg/dL (ref 0.40–1.50)
GFR: 87.36 mL/min (ref >60.00)
GLUCOSE: 89 mg/dL (ref 70–99)
POTASSIUM: 4.7 meq/L (ref 3.5–5.1)
SODIUM: 140 meq/L (ref 135–145)
TOTAL PROTEIN: 7.6 g/dL (ref 6.0–8.3)
Total Bilirubin: 0.7 mg/dL (ref 0.2–1.2)

## 2016-09-11 LAB — CBC WITH DIFFERENTIAL/PLATELET
BASOS ABS: 0 10*3/uL (ref 0.0–0.1)
BASOS PCT: 0.4 % (ref 0.0–3.0)
EOS ABS: 0.1 10*3/uL (ref 0.0–0.7)
Eosinophils Relative: 1.4 % (ref 0.0–5.0)
HCT: 44.8 % (ref 39.0–52.0)
Hemoglobin: 15.2 g/dL (ref 13.0–17.0)
LYMPHS ABS: 3 10*3/uL (ref 0.7–4.0)
Lymphocytes Relative: 30.9 % (ref 12.0–46.0)
MCHC: 34 g/dL (ref 30.0–36.0)
MCV: 80 fl (ref 78.0–100.0)
MONO ABS: 0.8 10*3/uL (ref 0.1–1.0)
Monocytes Relative: 8.6 % (ref 3.0–12.0)
NEUTROS ABS: 5.6 10*3/uL (ref 1.4–7.7)
NEUTROS PCT: 58.7 % (ref 43.0–77.0)
PLATELETS: 296 10*3/uL (ref 150.0–400.0)
RBC: 5.6 Mil/uL (ref 4.22–5.81)
RDW: 14.6 % (ref 11.5–15.5)
WBC: 9.6 10*3/uL (ref 4.0–10.5)

## 2016-09-11 LAB — C-REACTIVE PROTEIN: CRP: 0.8 mg/dL (ref 0.5–20.0)

## 2016-09-11 LAB — CK: Total CK: 112 U/L (ref 7–232)

## 2016-09-11 LAB — VITAMIN B12: VITAMIN B 12: 1210 pg/mL — AB (ref 211–911)

## 2016-09-11 LAB — TSH: TSH: 1.37 u[IU]/mL (ref 0.35–4.50)

## 2016-09-11 LAB — HEMOGLOBIN A1C: HEMOGLOBIN A1C: 5.8 % (ref 4.6–6.5)

## 2016-09-11 LAB — SEDIMENTATION RATE: Sed Rate: 15 mm/hr (ref 0–20)

## 2016-09-11 MED ORDER — DULOXETINE HCL 60 MG PO CPEP: 60.0000 mg | ORAL_CAPSULE | Freq: Every day | ORAL | 12 refills | Status: DC

## 2016-09-11 MED ORDER — GABAPENTIN 100 MG PO CAPS: ORAL_CAPSULE | ORAL | 0 refills | Status: DC

## 2016-09-11 NOTE — Telephone Encounter (Signed)
Patient calling to scheduled appt for lab work.  I advised patient we would need orders from his pcp before an appt could be scheduled.  Patient states he is concerned about nerve pain.  Appt scheduled at 10:30 with pcp as another patient called and cancelled their appt while on th e
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
## 2016-09-11 NOTE — Progress Notes (Signed)
Pre visit review using our clinic review tool, if applicable. No additional management support is needed unless otherwise documented below in the visit note. 
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
## 2016-09-12 LAB — ANA: ANA: NEGATIVE

## 2016-09-16 ENCOUNTER — Encounter: Payer: Self-pay | Admitting: Family Medicine

## 2016-09-17 ENCOUNTER — Encounter: Payer: Self-pay | Admitting: Family Medicine

## 2016-09-24 ENCOUNTER — Ambulatory Visit (INDEPENDENT_AMBULATORY_CARE_PROVIDER_SITE_OTHER): Payer: Federal, State, Local not specified - PPO | Admitting: Family Medicine

## 2016-09-24 ENCOUNTER — Encounter: Payer: Self-pay | Admitting: Family Medicine

## 2016-09-24 VITALS — BP 138/88 | HR 83 | Temp 98.5°F | Resp 18 | Wt 258.1 lb

## 2016-09-24 DIAGNOSIS — R202 Paresthesia of skin: Secondary | ICD-10-CM

## 2016-09-24 DIAGNOSIS — G4701 Insomnia due to medical condition: Secondary | ICD-10-CM

## 2016-09-24 MED ORDER — CLONAZEPAM 1 MG PO TABS: 1.0000 mg | ORAL_TABLET | Freq: Every day | ORAL | 1 refills | Status: DC
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
## 2016-09-24 NOTE — Progress Notes (Signed)
Pre visit review using our clinic review tool, if applicable. No additional management support is needed unless otherwise documented below in the visit note. 

## 2016-10-09 ENCOUNTER — Other Ambulatory Visit: Payer: Self-pay | Admitting: Family Medicine

## 2016-10-09 NOTE — Telephone Encounter (Signed)
Patient aware.  He does not take this medication.

## 2016-10-09 NOTE — Telephone Encounter (Signed)
His neurologist, Dr. Krista Blue, took him off of this medication. Therefore I am going to deny this RF request.  If pt has still been taking it, then I recommend he call Dr. Rhea Belton office for RF request of this med.-thx

## 2016-10-09 NOTE — Telephone Encounter (Signed)
RF request for gabapentin LOV: 09/11/16 Next ov: 10/25/16 Last written: 09/21/16 #150 w/ 0RF  Please advise. Thanks.

## 2016-10-09 NOTE — Telephone Encounter (Signed)
Left message for pt to call back  °

## 2016-10-17 ENCOUNTER — Ambulatory Visit (INDEPENDENT_AMBULATORY_CARE_PROVIDER_SITE_OTHER): Payer: Federal, State, Local not specified - PPO | Admitting: Neurology

## 2016-10-17 ENCOUNTER — Encounter: Payer: Self-pay | Admitting: Neurology

## 2016-10-17 VITALS — BP 131/82 | HR 74 | Ht 74.0 in | Wt 254.0 lb

## 2016-10-17 DIAGNOSIS — R410 Disorientation, unspecified: Secondary | ICD-10-CM

## 2016-10-17 DIAGNOSIS — H524 Presbyopia: Secondary | ICD-10-CM | POA: Diagnosis not present

## 2016-10-17 DIAGNOSIS — R202 Paresthesia of skin: Secondary | ICD-10-CM | POA: Diagnosis not present

## 2016-10-17 MED ORDER — DULOXETINE HCL 60 MG PO CPEP: 60.0000 mg | ORAL_CAPSULE | Freq: Two times a day (BID) | ORAL | 11 refills | Status: DC
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
## 2016-10-18 ENCOUNTER — Ambulatory Visit (INDEPENDENT_AMBULATORY_CARE_PROVIDER_SITE_OTHER): Payer: Self-pay

## 2016-10-18 ENCOUNTER — Encounter: Payer: Self-pay | Admitting: Family Medicine

## 2016-10-18 DIAGNOSIS — Z0289 Encounter for other administrative examinations: Secondary | ICD-10-CM

## 2016-10-18 DIAGNOSIS — R410 Disorientation, unspecified: Secondary | ICD-10-CM

## 2016-10-18 DIAGNOSIS — R202 Paresthesia of skin: Secondary | ICD-10-CM

## 2016-10-19 DIAGNOSIS — R410 Disorientation, unspecified: Secondary | ICD-10-CM | POA: Diagnosis not present

## 2016-10-19 DIAGNOSIS — R202 Paresthesia of skin: Secondary | ICD-10-CM | POA: Diagnosis not present

## 2016-10-25 ENCOUNTER — Ambulatory Visit (INDEPENDENT_AMBULATORY_CARE_PROVIDER_SITE_OTHER): Payer: Federal, State, Local not specified - PPO | Admitting: Family Medicine

## 2016-10-25 ENCOUNTER — Encounter: Payer: Self-pay | Admitting: Family Medicine

## 2016-10-25 VITALS — BP 122/87 | HR 76 | Temp 97.5°F | Resp 16 | Ht 74.0 in | Wt 257.0 lb

## 2016-10-25 DIAGNOSIS — R202 Paresthesia of skin: Secondary | ICD-10-CM

## 2016-10-25 DIAGNOSIS — G4733 Obstructive sleep apnea (adult) (pediatric): Secondary | ICD-10-CM

## 2016-10-25 DIAGNOSIS — Z87820 Personal history of traumatic brain injury: Secondary | ICD-10-CM

## 2016-10-25 MED ORDER — CLONAZEPAM 1 MG PO TABS: 1.0000 mg | ORAL_TABLET | Freq: Every day | ORAL | 5 refills | Status: DC
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
## 2016-10-25 NOTE — Progress Notes (Signed)
Pre visit review using our clinic review tool, if applicable. No additional management support is needed unless otherwise documented below in the visit note. 

## 2016-12-12 ENCOUNTER — Other Ambulatory Visit: Payer: Self-pay | Admitting: *Deleted

## 2016-12-12 MED ORDER — DULOXETINE HCL 60 MG PO CPEP: 60.0000 mg | ORAL_CAPSULE | Freq: Two times a day (BID) | ORAL | 3 refills | Status: DC

## 2016-12-16 ENCOUNTER — Encounter: Payer: Self-pay | Admitting: Neurology

## 2016-12-24 ENCOUNTER — Encounter: Payer: Self-pay | Admitting: Family Medicine

## 2016-12-24 NOTE — Telephone Encounter (Signed)
Please advise. Thanks.  

## 2016-12-31 ENCOUNTER — Encounter: Payer: Self-pay | Admitting: Family Medicine

## 2016-12-31 ENCOUNTER — Ambulatory Visit (INDEPENDENT_AMBULATORY_CARE_PROVIDER_SITE_OTHER): Payer: Federal, State, Local not specified - PPO | Admitting: Family Medicine

## 2016-12-31 VITALS — BP 140/91 | HR 81 | Temp 97.9°F | Resp 16 | Ht 74.5 in | Wt 264.0 lb

## 2016-12-31 DIAGNOSIS — Z125 Encounter for screening for malignant neoplasm of prostate: Secondary | ICD-10-CM | POA: Diagnosis not present

## 2016-12-31 DIAGNOSIS — Z0000 Encounter for general adult medical examination without abnormal findings: Secondary | ICD-10-CM | POA: Diagnosis not present

## 2016-12-31 LAB — COMPREHENSIVE METABOLIC PANEL
ALT: 25 U/L (ref 0–53)
AST: 16 U/L (ref 0–37)
Albumin: 4.5 g/dL (ref 3.5–5.2)
Alkaline Phosphatase: 53 U/L (ref 39–117)
BUN: 16 mg/dL (ref 6–23)
CALCIUM: 9.8 mg/dL (ref 8.4–10.5)
CHLORIDE: 99 meq/L (ref 96–112)
CO2: 31 meq/L (ref 19–32)
Creatinine, Ser: 0.98 mg/dL (ref 0.40–1.50)
GFR: 85.2 mL/min (ref >60.00)
Glucose, Bld: 97 mg/dL (ref 70–99)
POTASSIUM: 4.2 meq/L (ref 3.5–5.1)
Sodium: 138 mEq/L (ref 135–145)
Total Bilirubin: 0.7 mg/dL (ref 0.2–1.2)
Total Protein: 7.3 g/dL (ref 6.0–8.3)

## 2016-12-31 LAB — TSH: TSH: 1.83 u[IU]/mL (ref 0.35–4.50)

## 2016-12-31 LAB — LIPID PANEL
CHOL/HDL RATIO: 6
Cholesterol: 212 mg/dL — ABNORMAL HIGH (ref 0–200)
HDL: 38.1 mg/dL — AB (ref >39.00)
LDL CALC: 137 mg/dL — AB (ref 0–99)
NONHDL: 173.51
Triglycerides: 182 mg/dL — ABNORMAL HIGH (ref 0.0–149.0)
VLDL: 36.4 mg/dL (ref 0.0–40.0)

## 2016-12-31 LAB — CBC WITH DIFFERENTIAL/PLATELET
Basophils Absolute: 0.1 10*3/uL (ref 0.0–0.1)
Basophils Relative: 0.8 % (ref 0.0–3.0)
EOS PCT: 1.9 % (ref 0.0–5.0)
Eosinophils Absolute: 0.2 10*3/uL (ref 0.0–0.7)
HEMATOCRIT: 43.8 % (ref 39.0–52.0)
HEMOGLOBIN: 14.6 g/dL (ref 13.0–17.0)
LYMPHS ABS: 2.4 10*3/uL (ref 0.7–4.0)
LYMPHS PCT: 24.5 % (ref 12.0–46.0)
MCHC: 33.4 g/dL (ref 30.0–36.0)
MCV: 82.2 fl (ref 78.0–100.0)
MONOS PCT: 9.1 % (ref 3.0–12.0)
Monocytes Absolute: 0.9 10*3/uL (ref 0.1–1.0)
NEUTROS PCT: 63.7 % (ref 43.0–77.0)
Neutro Abs: 6.3 10*3/uL (ref 1.4–7.7)
Platelets: 305 10*3/uL (ref 150.0–400.0)
RBC: 5.33 Mil/uL (ref 4.22–5.81)
RDW: 14.9 % (ref 11.5–15.5)
WBC: 9.9 10*3/uL (ref 4.0–10.5)

## 2016-12-31 LAB — PSA: PSA: 1.19 ng/mL (ref 0.10–4.00)

## 2016-12-31 NOTE — Progress Notes (Signed)
Pre visit review using our clinic review tool, if applicable. No additional management support is needed unless otherwise documented below in the visit note. 
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
## 2017-01-10 DIAGNOSIS — M71572 Other bursitis, not elsewhere classified, left ankle and foot: Secondary | ICD-10-CM | POA: Diagnosis not present

## 2017-01-10 DIAGNOSIS — M722 Plantar fascial fibromatosis: Secondary | ICD-10-CM | POA: Diagnosis not present

## 2017-01-10 DIAGNOSIS — M71571 Other bursitis, not elsewhere classified, right ankle and foot: Secondary | ICD-10-CM | POA: Diagnosis not present

## 2017-01-17 DIAGNOSIS — M5412 Radiculopathy, cervical region: Secondary | ICD-10-CM | POA: Diagnosis not present

## 2017-01-17 DIAGNOSIS — M71572 Other bursitis, not elsewhere classified, left ankle and foot: Secondary | ICD-10-CM | POA: Diagnosis not present

## 2017-01-17 DIAGNOSIS — R251 Tremor, unspecified: Secondary | ICD-10-CM | POA: Diagnosis not present

## 2017-01-17 DIAGNOSIS — M542 Cervicalgia: Secondary | ICD-10-CM | POA: Diagnosis not present

## 2017-01-17 DIAGNOSIS — R51 Headache: Secondary | ICD-10-CM | POA: Diagnosis not present

## 2017-01-17 DIAGNOSIS — M71571 Other bursitis, not elsewhere classified, right ankle and foot: Secondary | ICD-10-CM | POA: Diagnosis not present

## 2017-01-17 DIAGNOSIS — M5417 Radiculopathy, lumbosacral region: Secondary | ICD-10-CM | POA: Diagnosis not present

## 2017-01-17 DIAGNOSIS — G4733 Obstructive sleep apnea (adult) (pediatric): Secondary | ICD-10-CM | POA: Diagnosis not present

## 2017-01-17 DIAGNOSIS — M722 Plantar fascial fibromatosis: Secondary | ICD-10-CM | POA: Diagnosis not present

## 2017-01-17 DIAGNOSIS — R5383 Other fatigue: Secondary | ICD-10-CM | POA: Diagnosis not present

## 2017-01-17 DIAGNOSIS — G5623 Lesion of ulnar nerve, bilateral upper limbs: Secondary | ICD-10-CM | POA: Diagnosis not present

## 2017-01-17 DIAGNOSIS — R202 Paresthesia of skin: Secondary | ICD-10-CM | POA: Diagnosis not present

## 2017-01-17 DIAGNOSIS — G5603 Carpal tunnel syndrome, bilateral upper limbs: Secondary | ICD-10-CM | POA: Diagnosis not present

## 2017-01-17 DIAGNOSIS — G2581 Restless legs syndrome: Secondary | ICD-10-CM | POA: Diagnosis not present

## 2017-02-13 DIAGNOSIS — G5603 Carpal tunnel syndrome, bilateral upper limbs: Secondary | ICD-10-CM | POA: Diagnosis not present

## 2017-02-13 DIAGNOSIS — R251 Tremor, unspecified: Secondary | ICD-10-CM | POA: Diagnosis not present

## 2017-02-13 DIAGNOSIS — G5623 Lesion of ulnar nerve, bilateral upper limbs: Secondary | ICD-10-CM | POA: Diagnosis not present

## 2017-02-13 DIAGNOSIS — G2581 Restless legs syndrome: Secondary | ICD-10-CM | POA: Diagnosis not present

## 2017-02-13 DIAGNOSIS — M5417 Radiculopathy, lumbosacral region: Secondary | ICD-10-CM | POA: Diagnosis not present

## 2017-02-13 DIAGNOSIS — M542 Cervicalgia: Secondary | ICD-10-CM | POA: Diagnosis not present

## 2017-02-17 ENCOUNTER — Encounter: Payer: Self-pay | Admitting: Family Medicine

## 2017-02-17 ENCOUNTER — Encounter: Payer: Self-pay | Admitting: Neurology

## 2017-02-18 NOTE — Telephone Encounter (Signed)
Please advise. Thanks.  

## 2017-02-18 NOTE — Telephone Encounter (Signed)
Continue to keep f/u appts and follow the instructions/recommendations of your new neurologist--Dr Runheim at [facility] in [place].  Nira Conn, will you cancel appt's pt has with Dr. Krista Blue at [facility].-thx

## 2017-02-18 NOTE — Telephone Encounter (Signed)
Does pt want [place] to cancel any f/u visits with OUR office or does he want [place] to cancel his f/u appts with Dr. Krista Blue? I can't tell from the message exactly what he wants.-thx

## 2017-02-18 NOTE — Telephone Encounter (Signed)
Message sent to Dr. Anitra Lauth by mistake. Pt message ment for Dr. Rhea Belton office.

## 2017-03-13 ENCOUNTER — Ambulatory Visit (INDEPENDENT_AMBULATORY_CARE_PROVIDER_SITE_OTHER): Payer: Federal, State, Local not specified - PPO | Admitting: Family Medicine

## 2017-03-13 ENCOUNTER — Encounter: Payer: Self-pay | Admitting: Family Medicine

## 2017-03-13 VITALS — BP 132/80 | HR 94 | Temp 98.5°F | Ht 74.5 in | Wt 264.0 lb

## 2017-03-13 DIAGNOSIS — L03012 Cellulitis of left finger: Secondary | ICD-10-CM | POA: Diagnosis not present

## 2017-03-13 MED ORDER — DOXYCYCLINE HYCLATE 100 MG PO TABS: 100.0000 mg | ORAL_TABLET | Freq: Two times a day (BID) | ORAL | 0 refills | Status: DC

## 2017-03-13 NOTE — Progress Notes (Signed)
Jacob James is a 51 y.o. male here for an acute visit.  History of Present Illness:   Water quality scientist, CMA, acting as scribe for Dr. Juleen China.  Hand Injury   The incident occurred 5 to 7 days ago. The incident occurred at home. The injury mechanism is unknown. The pain is present in the left hand. The quality of the pain is described as aching. The pain is moderate. The pain has been constant since the incident. Pertinent negatives include no chest pain, numbness or tingling. The symptoms are aggravated by palpation and movement. He has tried ice for the symptoms. The treatment provided mild relief.   PMHx, SurgHx, SocialHx, Medications, and Allergies were reviewed in the Visit Navigator and updated as appropriate.  Current Medications:   Current Outpatient Prescriptions:  .  albuterol (PROAIR HFA) 108 (90 BASE) MCG/ACT inhaler, Inhale 2 puffs into the lungs every 4 (four) hours as needed for wheezing or shortness of breath., Disp: 1 Inhaler, Rfl: 0 .  aspirin EC 81 MG tablet, Take 243 mg by mouth daily., Disp: , Rfl:  .  clonazePAM (KLONOPIN) 1 MG tablet, Take 1 tablet (1 mg total) by mouth at bedtime., Disp: 30 tablet, Rfl: 5 .  diclofenac (VOLTAREN) 75 MG EC tablet, Take 75 mg by mouth 2 (two) times daily., Disp: , Rfl:  .  omeprazole (PRILOSEC) 40 MG capsule, Take 1 capsule (40 mg total) by mouth daily., Disp: 30 capsule, Rfl: 6 .  pregabalin (LYRICA) 300 MG capsule, Take 300 mg by mouth daily., Disp: , Rfl:  .  rosuvastatin (CRESTOR) 20 MG tablet, Take 1 tablet (20 mg total) by mouth daily., Disp: 90 tablet, Rfl: 3 .  tiZANidine (ZANAFLEX) 4 MG tablet, Take 4 mg by mouth 3 (three) times daily., Disp: , Rfl:    No Known Allergies   Review of Systems:   Review of Systems  Constitutional: Negative for chills, fever, malaise/fatigue and weight loss.  Respiratory: Negative for cough, shortness of breath and wheezing.   Cardiovascular: Negative for chest pain, palpitations and leg  swelling.  Gastrointestinal: Negative for abdominal pain, constipation, diarrhea, nausea and vomiting.  Genitourinary: Negative for dysuria and urgency.  Musculoskeletal: Negative for joint pain and myalgias.  Skin: Negative for rash.  Neurological: Negative for dizziness, tingling, numbness and headaches.  Psychiatric/Behavioral: Negative for depression, substance abuse and suicidal ideas. The patient is not nervous/anxious.     Vitals:   Vitals:   03/13/17 1609  BP: 132/80  Pulse: 94  Temp: 98.5 F (36.9 C)  TempSrc: Oral  SpO2: 99%  Weight: 264 lb (119.7 kg)  Height: 6' 2.5" (1.892 m)     Body mass index is 33.44 kg/m.  Physical Exam:   Physical Exam  Constitutional: He is oriented to person, place, and time. He appears well-developed and well-nourished. No distress.  HENT:  Head: Normocephalic and atraumatic.  Right Ear: External ear normal.  Left Ear: External ear normal.  Nose: Nose normal.  Mouth/Throat: Oropharynx is clear and moist.  Eyes: Conjunctivae and EOM are normal. Pupils are equal, round, and reactive to light.  Neck: Normal range of motion. Neck supple.  Cardiovascular: Normal rate, regular rhythm, normal heart sounds and intact distal pulses.   Pulmonary/Chest: Effort normal and breath sounds normal.  Abdominal: Soft. Bowel sounds are normal.  Musculoskeletal: Normal range of motion.  Neurological: He is alert and oriented to person, place, and time.  Skin:  Left middle finger paronychia.  Psychiatric: He has a  normal mood and affect. His behavior is normal. Judgment and thought content normal.  Nursing note and vitals reviewed.   Assessment and Plan:    Jacob James was seen today for acute visit and finger injury.  Diagnoses and all orders for this visit:  Paronychia of finger of left hand Comments: I&D left middle finger in usual manner without complications. Purulent fluid from wound. No blood loss. Wound care reviewed. Orders: -      doxycycline (VIBRA-TABS) 100 MG tablet; Take 1 tablet (100 mg total) by mouth 2 (two) times daily.    . Reviewed expectations re: course of current medical issues. . Discussed self-management of symptoms. . Outlined signs and symptoms indicating need for more acute intervention. . Patient verbalized understanding and all questions were answered. . See orders for this visit as documented in the electronic medical record. . Patient received an After-Visit Summary.  CMA served as Education administrator during this visit. History, Physical, and Plan performed by medical provider. Documentation and orders reviewed and attested to. Briscoe Deutscher, D.O.  [place], [facility] 03/13/2017

## 2017-03-14 ENCOUNTER — Other Ambulatory Visit: Payer: Self-pay | Admitting: Family Medicine

## 2017-03-14 DIAGNOSIS — Z8719 Personal history of other diseases of the digestive system: Secondary | ICD-10-CM

## 2017-03-15 DIAGNOSIS — G4730 Sleep apnea, unspecified: Secondary | ICD-10-CM | POA: Diagnosis not present

## 2017-04-02 ENCOUNTER — Encounter: Payer: Self-pay | Admitting: Family Medicine

## 2017-04-03 NOTE — Telephone Encounter (Signed)
Please advise. Thanks.  

## 2017-04-04 DIAGNOSIS — G4733 Obstructive sleep apnea (adult) (pediatric): Secondary | ICD-10-CM | POA: Diagnosis not present

## 2017-04-17 ENCOUNTER — Ambulatory Visit: Payer: Federal, State, Local not specified - PPO | Admitting: Nurse Practitioner

## 2017-04-25 DIAGNOSIS — R251 Tremor, unspecified: Secondary | ICD-10-CM | POA: Diagnosis not present

## 2017-04-25 DIAGNOSIS — M542 Cervicalgia: Secondary | ICD-10-CM | POA: Diagnosis not present

## 2017-04-25 DIAGNOSIS — G5603 Carpal tunnel syndrome, bilateral upper limbs: Secondary | ICD-10-CM | POA: Diagnosis not present

## 2017-04-25 DIAGNOSIS — R202 Paresthesia of skin: Secondary | ICD-10-CM | POA: Diagnosis not present

## 2017-04-25 DIAGNOSIS — G5623 Lesion of ulnar nerve, bilateral upper limbs: Secondary | ICD-10-CM | POA: Diagnosis not present

## 2017-04-25 DIAGNOSIS — R413 Other amnesia: Secondary | ICD-10-CM | POA: Diagnosis not present

## 2017-04-25 DIAGNOSIS — M5417 Radiculopathy, lumbosacral region: Secondary | ICD-10-CM | POA: Diagnosis not present

## 2017-04-25 DIAGNOSIS — G2581 Restless legs syndrome: Secondary | ICD-10-CM | POA: Diagnosis not present

## 2017-05-02 DIAGNOSIS — G4730 Sleep apnea, unspecified: Secondary | ICD-10-CM | POA: Diagnosis not present

## 2017-05-02 DIAGNOSIS — G43009 Migraine without aura, not intractable, without status migrainosus: Secondary | ICD-10-CM | POA: Diagnosis not present

## 2017-05-02 DIAGNOSIS — R419 Unspecified symptoms and signs involving cognitive functions and awareness: Secondary | ICD-10-CM | POA: Diagnosis not present

## 2017-05-02 DIAGNOSIS — G603 Idiopathic progressive neuropathy: Secondary | ICD-10-CM | POA: Diagnosis not present

## 2017-05-17 ENCOUNTER — Ambulatory Visit (INDEPENDENT_AMBULATORY_CARE_PROVIDER_SITE_OTHER): Payer: Federal, State, Local not specified - PPO | Admitting: Family Medicine

## 2017-05-17 ENCOUNTER — Encounter: Payer: Self-pay | Admitting: Family Medicine

## 2017-05-17 VITALS — BP 121/88 | HR 100 | Temp 97.0°F | Resp 20 | Wt 261.2 lb

## 2017-05-17 DIAGNOSIS — B370 Candidal stomatitis: Secondary | ICD-10-CM | POA: Diagnosis not present

## 2017-05-17 MED ORDER — NYSTATIN 100000 UNIT/ML MT SUSP: 5.0000 mL | Freq: Four times a day (QID) | OROMUCOSAL | 0 refills | Status: DC
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
## 2017-05-17 NOTE — Patient Instructions (Signed)
Use swish and spit until completed. Pick up Biotene I showed you today and use in the morning and before bed.     Follow up with PCP if not resolved.

## 2017-05-27 DIAGNOSIS — G4733 Obstructive sleep apnea (adult) (pediatric): Secondary | ICD-10-CM | POA: Diagnosis not present

## 2017-05-30 DIAGNOSIS — M25512 Pain in left shoulder: Secondary | ICD-10-CM | POA: Diagnosis not present

## 2017-06-06 DIAGNOSIS — G5623 Lesion of ulnar nerve, bilateral upper limbs: Secondary | ICD-10-CM | POA: Diagnosis not present

## 2017-06-06 DIAGNOSIS — G5603 Carpal tunnel syndrome, bilateral upper limbs: Secondary | ICD-10-CM | POA: Diagnosis not present

## 2017-06-06 DIAGNOSIS — M5417 Radiculopathy, lumbosacral region: Secondary | ICD-10-CM | POA: Diagnosis not present

## 2017-06-06 DIAGNOSIS — M542 Cervicalgia: Secondary | ICD-10-CM | POA: Diagnosis not present

## 2017-06-06 DIAGNOSIS — G2581 Restless legs syndrome: Secondary | ICD-10-CM | POA: Diagnosis not present

## 2017-06-06 DIAGNOSIS — R251 Tremor, unspecified: Secondary | ICD-10-CM | POA: Diagnosis not present

## 2017-06-21 DIAGNOSIS — G4733 Obstructive sleep apnea (adult) (pediatric): Secondary | ICD-10-CM | POA: Diagnosis not present

## 2017-06-28 DIAGNOSIS — E785 Hyperlipidemia, unspecified: Secondary | ICD-10-CM | POA: Diagnosis not present

## 2017-07-20 ENCOUNTER — Other Ambulatory Visit: Payer: Self-pay | Admitting: Family Medicine

## 2017-07-20 DIAGNOSIS — Z8719 Personal history of other diseases of the digestive system: Secondary | ICD-10-CM

## 2017-07-22 NOTE — Telephone Encounter (Signed)
Refill request from [facility] for Prilosec, Rx eRxed.

## 2017-08-21 ENCOUNTER — Encounter: Payer: Self-pay | Admitting: Family Medicine

## 2017-08-21 DIAGNOSIS — R531 Weakness: Secondary | ICD-10-CM | POA: Diagnosis not present

## 2017-08-21 NOTE — Telephone Encounter (Signed)
Please advise. Thanks.  

## 2017-08-21 NOTE — Telephone Encounter (Signed)
Before starting any different cholesterol medication, we need to have him stop his current cholesterol med for 2 full weeks.  If his symptoms improve a lot, then he should restart the med to see if sx's return again. Have him make o/v appt in 3 weeks to go over this.-thx

## 2017-08-22 ENCOUNTER — Encounter: Payer: Self-pay | Admitting: Family Medicine

## 2017-08-22 NOTE — Telephone Encounter (Signed)
Please advise. Thanks.  

## 2017-08-22 NOTE — Telephone Encounter (Signed)
Although urine color abnormality is not listed as a side effect of the tizanadine or oxcarbazepine, it is not uncommon for urine color changes to come from COMBINATIONS of multiple meds or even from ingestion of certain foods.  Ultimately I cannot say for sure why your urine color is changed.  The most common cause for color change is dehydration: urine will turn a dark/cloudy yellow-to-orangish color.  Drink MUCH more water and see if it turns more light yellow/clear. If you want to come in for o/v to check a urinalysis to make sure there is no blood or sign of infection, then that is fine.-thx

## 2017-09-02 IMAGING — DX DG LUMBAR SPINE COMPLETE 4+V
5 series · 5 of 5 positions shown · non-contrast
Comparison: None.

CLINICAL DATA: Numbness and tingling in both legs

EXAM:
LUMBAR SPINE - COMPLETE 4+ VIEW

[l-spine ap]
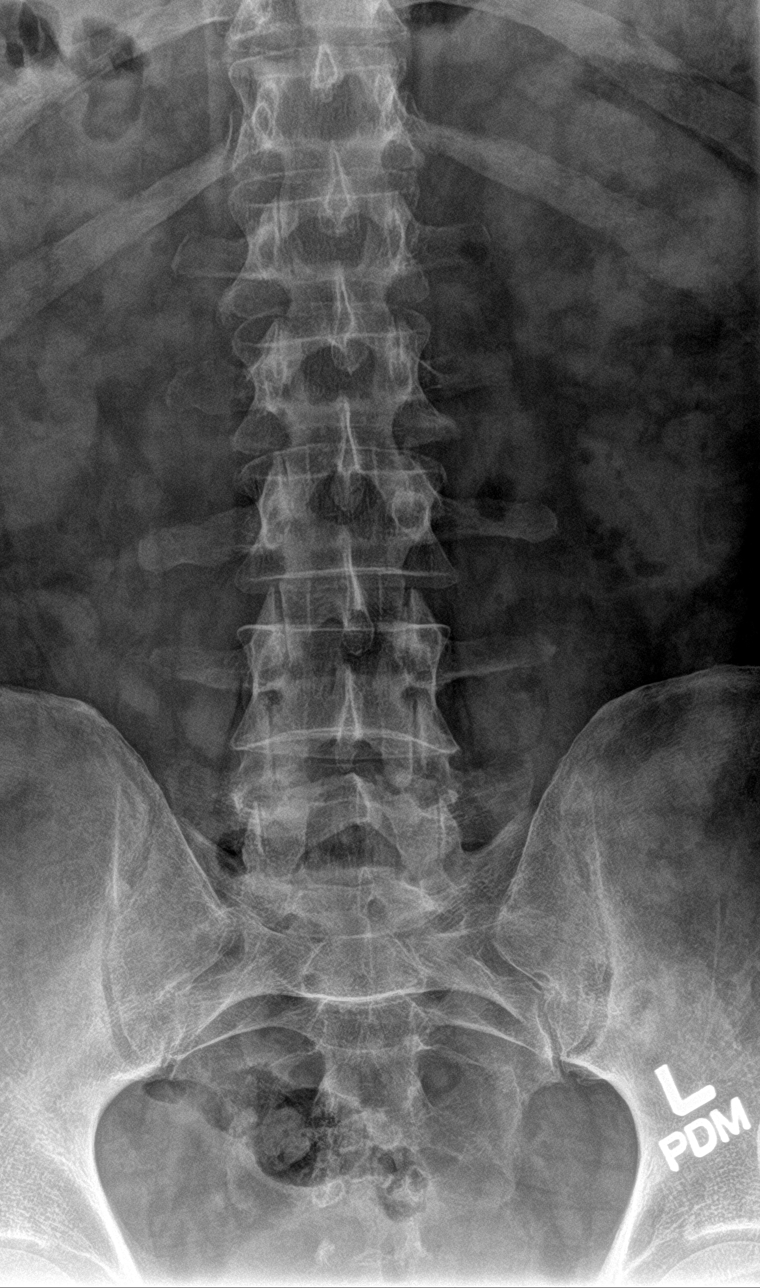

[l-spine obl (1 of 2)]
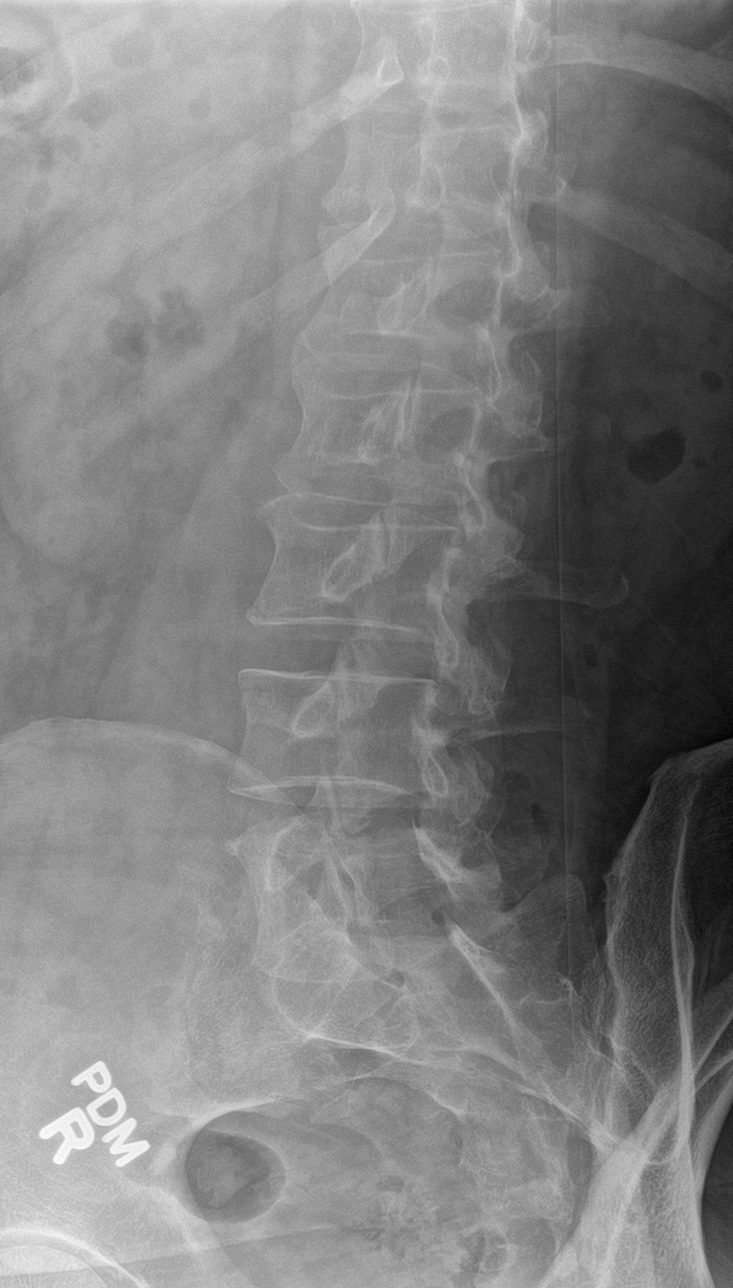

[l-spine obl (2 of 2)]
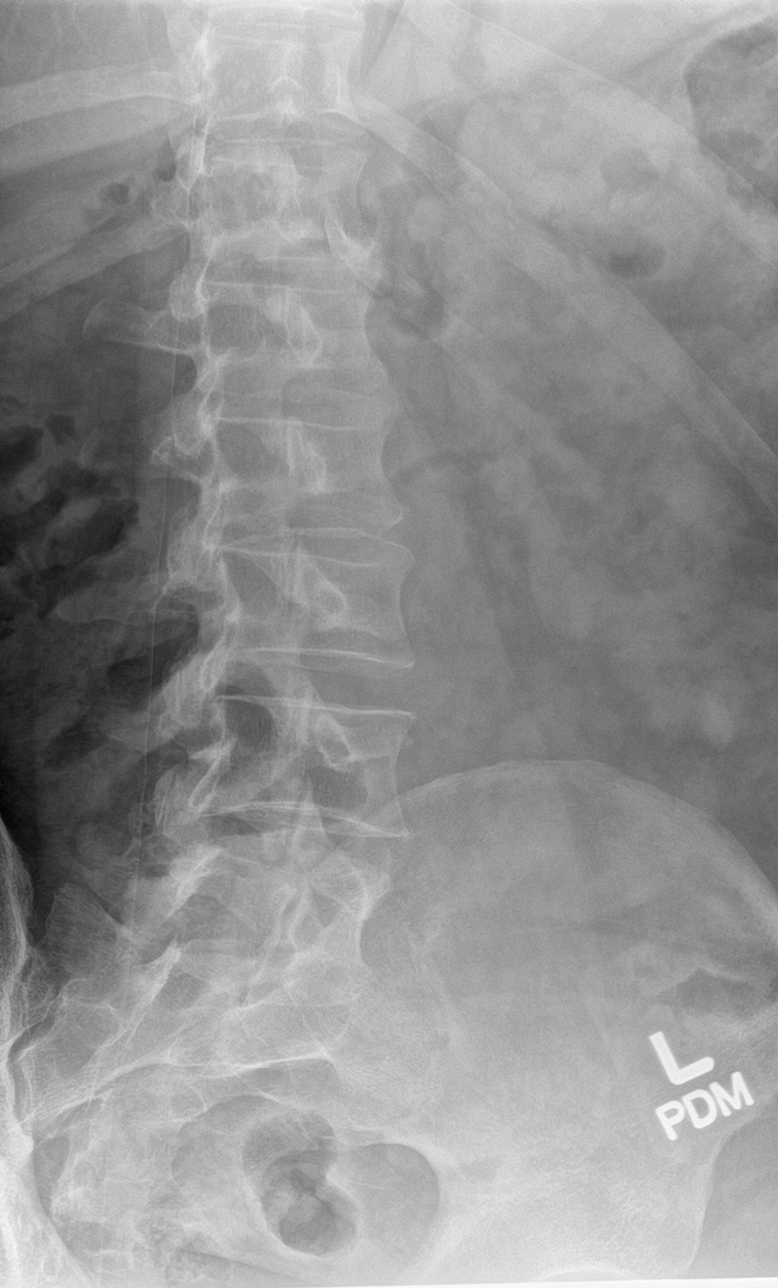

[l-spine lat]
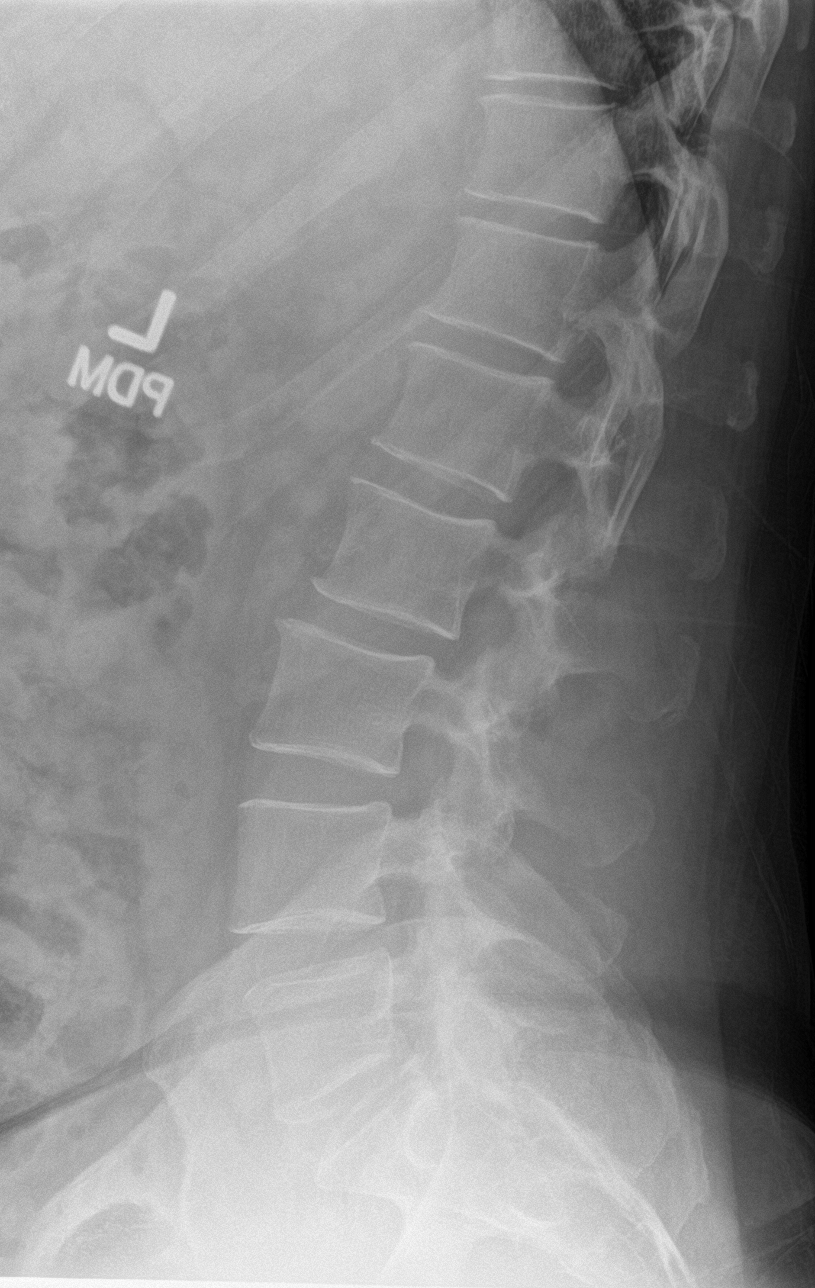

[l-spine spot]
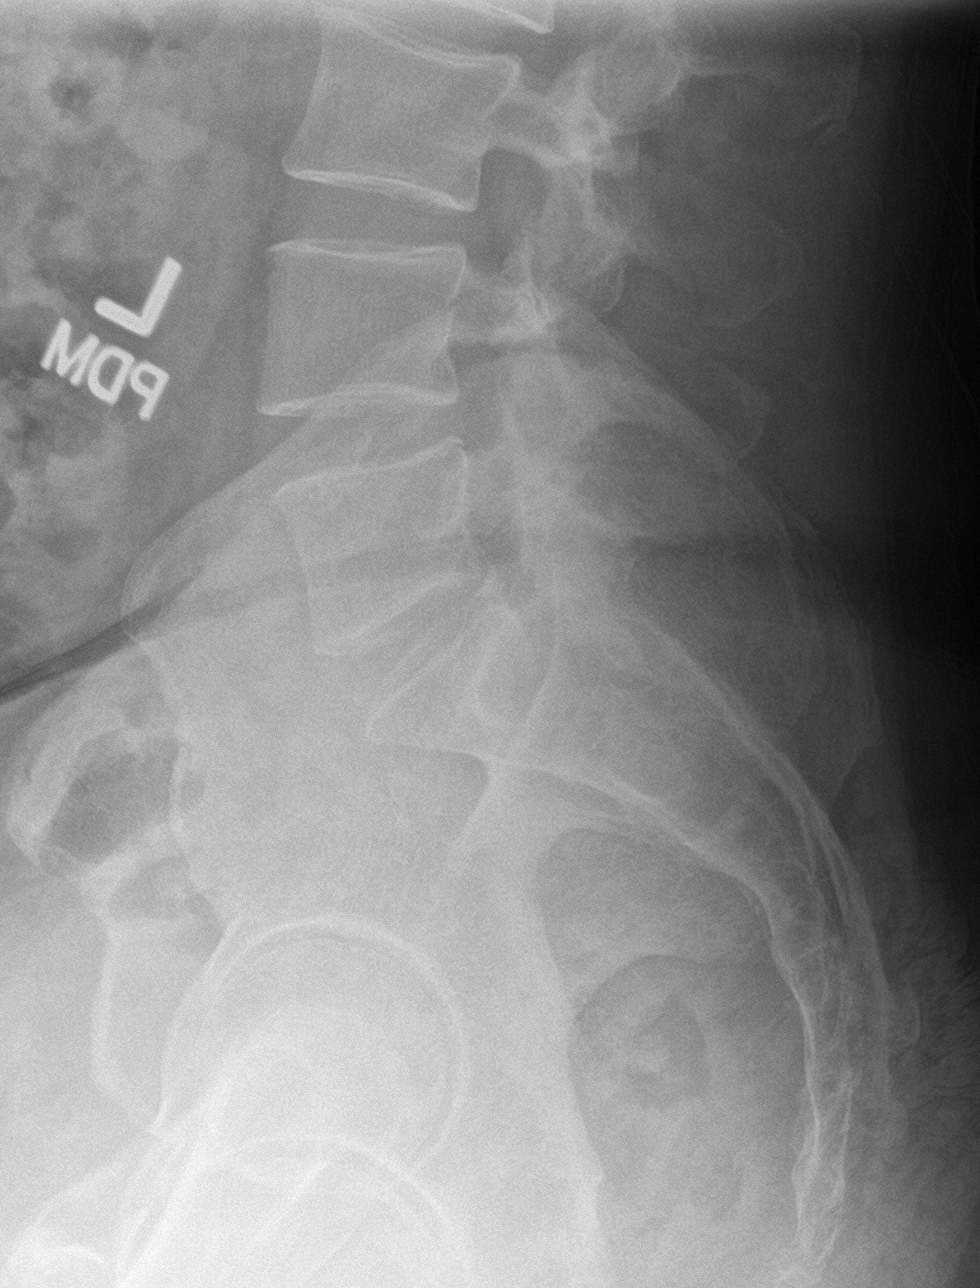

[5 of 5 positions shown; findings below may reference images not displayed]

FINDINGS: Five lumbar type vertebral bodies are well visualized. No pars
defects are seen. No anterolisthesis is noted. Very mild osteophytic
changes are seen. No soft tissue abnormality is noted.
IMPRESSION: Mild degenerative change without acute abnormality.

## 2017-09-18 ENCOUNTER — Other Ambulatory Visit: Payer: Self-pay | Admitting: *Deleted

## 2017-09-18 MED ORDER — ROSUVASTATIN CALCIUM 20 MG PO TABS: 20.0000 mg | ORAL_TABLET | Freq: Every day | ORAL | 1 refills | Status: DC

## 2017-10-21 ENCOUNTER — Encounter: Payer: Self-pay | Admitting: Family Medicine

## 2017-10-21 DIAGNOSIS — E7800 Pure hypercholesterolemia, unspecified: Secondary | ICD-10-CM

## 2017-10-21 NOTE — Telephone Encounter (Signed)
OK, FLP ordered--he can come in for fasting lab visit. When we get results, we'll determine the next step. Did he try taking the generic crestor every other day or try cutting the pill in half to see if he tolerated it, or did he just stop it abruptly w/out trying to decrease the dose? -thx

## 2017-10-21 NOTE — Telephone Encounter (Signed)
Please advise. Thanks.  

## 2017-11-07 DIAGNOSIS — M47816 Spondylosis without myelopathy or radiculopathy, lumbar region: Secondary | ICD-10-CM | POA: Diagnosis not present

## 2017-11-07 DIAGNOSIS — M1288 Other specific arthropathies, not elsewhere classified, other specified site: Secondary | ICD-10-CM | POA: Diagnosis not present

## 2017-11-11 ENCOUNTER — Telehealth: Payer: Self-pay | Admitting: *Deleted

## 2017-11-11 DIAGNOSIS — Z8719 Personal history of other diseases of the digestive system: Secondary | ICD-10-CM

## 2017-11-11 MED ORDER — OMEPRAZOLE 40 MG PO CPDR: DELAYED_RELEASE_CAPSULE | ORAL | 0 refills | Status: DC

## 2017-11-14 ENCOUNTER — Other Ambulatory Visit (INDEPENDENT_AMBULATORY_CARE_PROVIDER_SITE_OTHER): Payer: Federal, State, Local not specified - PPO

## 2017-11-14 DIAGNOSIS — E7800 Pure hypercholesterolemia, unspecified: Secondary | ICD-10-CM

## 2017-11-14 LAB — LIPID PANEL
Cholesterol: 312 mg/dL — ABNORMAL HIGH (ref 0–200)
HDL: 32.4 mg/dL — ABNORMAL LOW (ref >39.00)
NonHDL: 280
Total CHOL/HDL Ratio: 10
Triglycerides: 376 mg/dL — ABNORMAL HIGH (ref 0.0–149.0)
VLDL: 75.2 mg/dL — ABNORMAL HIGH (ref 0.0–40.0)

## 2017-11-14 LAB — LDL CHOLESTEROL, DIRECT: LDL DIRECT: 217 mg/dL

## 2017-11-15 ENCOUNTER — Encounter: Payer: Self-pay | Admitting: Family Medicine

## 2017-11-15 NOTE — Telephone Encounter (Signed)
Rx sent 

## 2017-11-18 ENCOUNTER — Encounter: Payer: Self-pay | Admitting: Family Medicine

## 2017-11-20 ENCOUNTER — Encounter: Payer: Self-pay | Admitting: Family Medicine

## 2017-11-20 ENCOUNTER — Ambulatory Visit: Payer: Federal, State, Local not specified - PPO | Admitting: Family Medicine

## 2017-11-20 VITALS — BP 147/83 | HR 82 | Temp 97.7°F | Resp 16 | Ht 74.5 in | Wt 267.5 lb

## 2017-11-20 DIAGNOSIS — E7800 Pure hypercholesterolemia, unspecified: Secondary | ICD-10-CM | POA: Diagnosis not present

## 2017-11-20 DIAGNOSIS — M545 Low back pain: Secondary | ICD-10-CM | POA: Diagnosis not present

## 2017-11-20 DIAGNOSIS — E8881 Metabolic syndrome: Secondary | ICD-10-CM | POA: Diagnosis not present

## 2017-11-20 DIAGNOSIS — R7301 Impaired fasting glucose: Secondary | ICD-10-CM

## 2017-11-20 DIAGNOSIS — R202 Paresthesia of skin: Secondary | ICD-10-CM

## 2017-11-20 DIAGNOSIS — G8929 Other chronic pain: Secondary | ICD-10-CM | POA: Diagnosis not present

## 2017-11-20 LAB — COMPREHENSIVE METABOLIC PANEL
ALT: 18 U/L (ref 0–53)
AST: 20 U/L (ref 0–37)
Albumin: 4.4 g/dL (ref 3.5–5.2)
Alkaline Phosphatase: 58 U/L (ref 39–117)
BILIRUBIN TOTAL: 0.5 mg/dL (ref 0.2–1.2)
BUN: 15 mg/dL (ref 6–23)
CHLORIDE: 99 meq/L (ref 96–112)
CO2: 31 mEq/L (ref 19–32)
Calcium: 9.4 mg/dL (ref 8.4–10.5)
Creatinine, Ser: 1.29 mg/dL (ref 0.40–1.50)
GFR: 61.83 mL/min (ref >60.00)
GLUCOSE: 107 mg/dL — AB (ref 70–99)
Potassium: 4.4 mEq/L (ref 3.5–5.1)
Sodium: 137 mEq/L (ref 135–145)
Total Protein: 7.3 g/dL (ref 6.0–8.3)

## 2017-11-20 LAB — HEMOGLOBIN A1C: Hgb A1c MFr Bld: 6.1 % (ref 4.6–6.5)

## 2017-11-20 MED ORDER — PRAVASTATIN SODIUM 20 MG PO TABS: 20.0000 mg | ORAL_TABLET | Freq: Every day | ORAL | 2 refills | Status: DC
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
## 2017-11-21 ENCOUNTER — Telehealth: Payer: Self-pay | Admitting: Family Medicine

## 2017-11-21 ENCOUNTER — Encounter: Payer: Self-pay | Admitting: *Deleted

## 2017-11-21 NOTE — Telephone Encounter (Signed)
The CoQ10 is not simply sodium---it is a protein involved in many metabolic reactions in the body. The pill may have some sodium in it as a preservative, but it is not simply a sodium pill.  So, I recommend he take the otc CoQ10.  As far as the question about his prediabetes causing his neuropathy, I highly doubt that this is the case. Typically, nerve damage from excessive glucose does not occur unless a person has poorly controlled diabetes for years.  Reassure him.-thx

## 2017-11-21 NOTE — Telephone Encounter (Signed)
Copied from [place] [id number]. Topic: Quick Communication - Lab Results >> Nov 21, 2017  1:22 PM Scherrie Gerlach wrote: Pt states he has read his lab results on mychart and has 2 questions: 1.  Pt states the one med dr prescribed appears to be Sodium. (CoQ10 tablet ) Pt states he absolutely uses no salt, at all, on anything. Pt wants to know if he were to start eating a little salt, do yo think he could forgo the OTC sodium?  2.  Since pt states he is pre-diabetic, could that be causing the numbness in his feet and legs? Pt has upcoming neurology appt next week.   >> Nov 21, 2017  1:34 PM Scherrie Gerlach wrote: Madaline Brilliant to respond via mychart per pt

## 2017-11-21 NOTE — Telephone Encounter (Signed)
Please advise. Thanks.  

## 2017-11-21 NOTE — Telephone Encounter (Signed)
Left message for pt to call back.  See provider message below. Okay for PEC to advise pt.

## 2017-11-25 NOTE — Telephone Encounter (Signed)
Pt advised and voiced understanding.   

## 2017-12-05 DIAGNOSIS — R202 Paresthesia of skin: Secondary | ICD-10-CM | POA: Diagnosis not present

## 2017-12-05 DIAGNOSIS — G5623 Lesion of ulnar nerve, bilateral upper limbs: Secondary | ICD-10-CM | POA: Diagnosis not present

## 2017-12-05 DIAGNOSIS — R634 Abnormal weight loss: Secondary | ICD-10-CM | POA: Diagnosis not present

## 2017-12-05 DIAGNOSIS — M5417 Radiculopathy, lumbosacral region: Secondary | ICD-10-CM | POA: Diagnosis not present

## 2017-12-05 DIAGNOSIS — G251 Drug-induced tremor: Secondary | ICD-10-CM | POA: Diagnosis not present

## 2017-12-05 DIAGNOSIS — G5603 Carpal tunnel syndrome, bilateral upper limbs: Secondary | ICD-10-CM | POA: Diagnosis not present

## 2017-12-05 DIAGNOSIS — M791 Myalgia, unspecified site: Secondary | ICD-10-CM | POA: Diagnosis not present

## 2017-12-05 DIAGNOSIS — E559 Vitamin D deficiency, unspecified: Secondary | ICD-10-CM | POA: Diagnosis not present

## 2017-12-05 DIAGNOSIS — M2550 Pain in unspecified joint: Secondary | ICD-10-CM | POA: Diagnosis not present

## 2017-12-05 DIAGNOSIS — M5412 Radiculopathy, cervical region: Secondary | ICD-10-CM | POA: Diagnosis not present

## 2017-12-12 DIAGNOSIS — M542 Cervicalgia: Secondary | ICD-10-CM | POA: Insufficient documentation

## 2017-12-12 DIAGNOSIS — M545 Low back pain, unspecified: Secondary | ICD-10-CM | POA: Insufficient documentation

## 2017-12-12 DIAGNOSIS — G8929 Other chronic pain: Secondary | ICD-10-CM | POA: Insufficient documentation

## 2017-12-13 ENCOUNTER — Encounter: Payer: Self-pay | Admitting: Family Medicine

## 2017-12-13 NOTE — Telephone Encounter (Signed)
Called patient, no answer. Will try to call back at later.time.

## 2017-12-13 NOTE — Telephone Encounter (Signed)
Please see images scanned by pt on 12/13/17.

## 2017-12-13 NOTE — Telephone Encounter (Signed)
Reviewed labs scanned in by pt: none are concerning except his Vit D is low.  If neurologist did not put him on any vitamin D replacement, pls eRx ergocalciferol 50,000 Units tab, 1 tab q week x 12 weeks, #12, no RF. Needs to make lab appt here for recheck 25-OH vit D level, dx vitamin D deficiency.-thx  (Pt said he was concerned about Vit B12 also, but there was no vit B12 lab on the results scanned in).

## 2017-12-18 ENCOUNTER — Ambulatory Visit: Payer: Federal, State, Local not specified - PPO | Admitting: Family Medicine

## 2017-12-18 ENCOUNTER — Encounter: Payer: Self-pay | Admitting: Family Medicine

## 2017-12-18 VITALS — BP 150/87 | HR 81 | Temp 98.5°F | Wt 268.0 lb

## 2017-12-18 DIAGNOSIS — M791 Myalgia, unspecified site: Secondary | ICD-10-CM | POA: Diagnosis not present

## 2017-12-18 DIAGNOSIS — G729 Myopathy, unspecified: Secondary | ICD-10-CM

## 2017-12-18 DIAGNOSIS — R202 Paresthesia of skin: Secondary | ICD-10-CM

## 2017-12-18 DIAGNOSIS — G6289 Other specified polyneuropathies: Secondary | ICD-10-CM | POA: Diagnosis not present

## 2017-12-18 DIAGNOSIS — R5382 Chronic fatigue, unspecified: Secondary | ICD-10-CM
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
## 2017-12-19 LAB — COMPREHENSIVE METABOLIC PANEL
ALT: 21 U/L (ref 0–53)
AST: 16 U/L (ref 0–37)
Albumin: 4.4 g/dL (ref 3.5–5.2)
Alkaline Phosphatase: 63 U/L (ref 39–117)
BUN: 15 mg/dL (ref 6–23)
CALCIUM: 9.4 mg/dL (ref 8.4–10.5)
CHLORIDE: 100 meq/L (ref 96–112)
CO2: 30 meq/L (ref 19–32)
CREATININE: 0.91 mg/dL (ref 0.40–1.50)
GFR: 92.47 mL/min (ref >60.00)
Glucose, Bld: 98 mg/dL (ref 70–99)
Potassium: 4.4 mEq/L (ref 3.5–5.1)
Sodium: 137 mEq/L (ref 135–145)
Total Bilirubin: 0.4 mg/dL (ref 0.2–1.2)
Total Protein: 7.2 g/dL (ref 6.0–8.3)

## 2017-12-19 LAB — CBC WITH DIFFERENTIAL/PLATELET
BASOS PCT: 0.7 % (ref 0.0–3.0)
Basophils Absolute: 0.1 10*3/uL (ref 0.0–0.1)
EOS ABS: 0.1 10*3/uL (ref 0.0–0.7)
Eosinophils Relative: 1.6 % (ref 0.0–5.0)
HCT: 42.1 % (ref 39.0–52.0)
Hemoglobin: 14.6 g/dL (ref 13.0–17.0)
LYMPHS ABS: 2.4 10*3/uL (ref 0.7–4.0)
Lymphocytes Relative: 26 % (ref 12.0–46.0)
MCHC: 34.7 g/dL (ref 30.0–36.0)
MCV: 79.4 fl (ref 78.0–100.0)
MONO ABS: 0.7 10*3/uL (ref 0.1–1.0)
Monocytes Relative: 8.1 % (ref 3.0–12.0)
NEUTROS PCT: 63.6 % (ref 43.0–77.0)
Neutro Abs: 5.9 10*3/uL (ref 1.4–7.7)
PLATELETS: 313 10*3/uL (ref 150.0–400.0)
RBC: 5.31 Mil/uL (ref 4.22–5.81)
RDW: 14.8 % (ref 11.5–15.5)
WBC: 9.2 10*3/uL (ref 4.0–10.5)

## 2017-12-19 LAB — T4, FREE: FREE T4: 0.59 ng/dL — AB (ref 0.60–1.60)

## 2017-12-19 LAB — SEDIMENTATION RATE: Sed Rate: 12 mm/hr (ref 0–20)

## 2017-12-19 LAB — TSH: TSH: 1.71 u[IU]/mL (ref 0.35–4.50)

## 2017-12-19 LAB — VITAMIN B12: Vitamin B-12: 495 pg/mL (ref 211–911)

## 2017-12-19 LAB — URIC ACID: Uric Acid, Serum: 6.2 mg/dL (ref 4.0–7.8)

## 2017-12-19 LAB — CK: CK TOTAL: 79 U/L (ref 7–232)

## 2017-12-19 LAB — C-REACTIVE PROTEIN: CRP: 1.1 mg/dL (ref 0.5–20.0)

## 2017-12-20 DIAGNOSIS — G6289 Other specified polyneuropathies: Secondary | ICD-10-CM | POA: Diagnosis not present

## 2017-12-20 DIAGNOSIS — R202 Paresthesia of skin: Secondary | ICD-10-CM | POA: Diagnosis not present

## 2017-12-20 DIAGNOSIS — M791 Myalgia, unspecified site: Secondary | ICD-10-CM | POA: Diagnosis not present

## 2017-12-20 LAB — IMMUNOFIXATION, SERUM
IGG (IMMUNOGLOBIN G), SERUM: 1177 mg/dL (ref 700–1600)
IgA/Immunoglobulin A, Serum: 190 mg/dL (ref 90–386)
IgM (Immunoglobulin M), Srm: 81 mg/dL (ref 20–172)

## 2017-12-21 LAB — PROTEIN ELECTROPHORESIS, SERUM
ALBUMIN ELP: 4.5 g/dL (ref 3.8–4.8)
ALPHA 1: 0.3 g/dL (ref 0.2–0.3)
ALPHA 2: 0.7 g/dL (ref 0.5–0.9)
BETA 2: 0.5 g/dL (ref 0.2–0.5)
Beta Globulin: 0.5 g/dL (ref 0.4–0.6)
GAMMA GLOBULIN: 1.1 g/dL (ref 0.8–1.7)
Total Protein: 7.5 g/dL (ref 6.1–8.1)

## 2017-12-21 LAB — SJOGREN'S SYNDROME ANTIBODS(SSA + SSB)
SSA (RO) (ENA) ANTIBODY, IGG: NEGATIVE AI
SSB (LA) (ENA) ANTIBODY, IGG: NEGATIVE AI

## 2017-12-21 LAB — T3: T3 TOTAL: 102 ng/dL (ref 76–181)

## 2017-12-21 LAB — METHYLMALONIC ACID, SERUM: METHYLMALONIC ACID, QUANT: 89 nmol/L (ref 87–318)

## 2017-12-23 NOTE — Telephone Encounter (Signed)
I need to see him and examine what he feels under his arm.  If I feel anything that is suspicious for possibly being a lymph node then I'll have a cancer specialist see him for further evaluation.   Pls notify pt that ALL of his blood tests that I did at his most recent office visit came back normal.   His urine results are not back yet. Also, pls clarify with pt: our chart shows that his mom died of breast cancer and doesn't mention anything about Hodgkins' lymphoma.  --thx

## 2017-12-24 ENCOUNTER — Encounter: Payer: Self-pay | Admitting: Family Medicine

## 2017-12-25 ENCOUNTER — Ambulatory Visit (INDEPENDENT_AMBULATORY_CARE_PROVIDER_SITE_OTHER): Payer: Federal, State, Local not specified - PPO | Admitting: Family Medicine

## 2017-12-25 ENCOUNTER — Encounter: Payer: Self-pay | Admitting: Family Medicine

## 2017-12-25 VITALS — BP 138/82 | HR 75 | Temp 98.3°F | Resp 16 | Ht 74.5 in | Wt 270.2 lb

## 2017-12-25 DIAGNOSIS — M7989 Other specified soft tissue disorders: Secondary | ICD-10-CM | POA: Diagnosis not present

## 2017-12-25 LAB — PORPHYRINS, FRACTIONATED, RANDOM URINE
Coproporphyrin (CP) I: 19 ug/L — ABNORMAL HIGH (ref 0–15)
Coproporphyrin (CP) III: 40 ug/L (ref 0–49)
HEPTACARBOXYL (7-CP): 2 ug/L (ref 0–2)
PENTACARBOXYL (5-CP): 1 ug/L (ref 0–2)
UROPORPHYRINS (UP): 19 ug/L (ref 0–20)

## 2017-12-25 LAB — UPEP/TP, 24-HR URINE
ALBUMIN, U: 24.4 %
Alpha 1, Urine: 7.9 %
Alpha 2, Urine: 22.2 %
BETA UR: 31.3 %
Gamma Globulin, Urine: 14.3 %
PROTEIN 24H UR: 174 mg/(24.h) — AB (ref 30–150)
PROTEIN UR: 14.5 mg/dL
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
## 2017-12-27 ENCOUNTER — Encounter: Payer: Self-pay | Admitting: Family Medicine

## 2017-12-27 NOTE — Telephone Encounter (Signed)
Please help pt with record request. Thanks.

## 2017-12-27 NOTE — Telephone Encounter (Signed)
Yes, OK to stop aspirin.

## 2017-12-27 NOTE — Telephone Encounter (Signed)
Please advise. Thanks.  

## 2017-12-31 ENCOUNTER — Encounter: Payer: Self-pay | Admitting: Family Medicine

## 2018-01-01 NOTE — Telephone Encounter (Signed)
Please advise. Thanks.  

## 2018-01-15 DIAGNOSIS — Z6833 Body mass index (BMI) 33.0-33.9, adult: Secondary | ICD-10-CM | POA: Diagnosis not present

## 2018-01-15 DIAGNOSIS — Z0189 Encounter for other specified special examinations: Secondary | ICD-10-CM | POA: Diagnosis not present

## 2018-01-15 DIAGNOSIS — N529 Male erectile dysfunction, unspecified: Secondary | ICD-10-CM | POA: Diagnosis not present

## 2018-01-15 DIAGNOSIS — E785 Hyperlipidemia, unspecified: Secondary | ICD-10-CM | POA: Diagnosis not present

## 2018-01-16 DIAGNOSIS — G8929 Other chronic pain: Secondary | ICD-10-CM | POA: Diagnosis not present

## 2018-01-16 DIAGNOSIS — G5603 Carpal tunnel syndrome, bilateral upper limbs: Secondary | ICD-10-CM | POA: Diagnosis not present

## 2018-01-16 DIAGNOSIS — R202 Paresthesia of skin: Secondary | ICD-10-CM | POA: Diagnosis not present

## 2018-01-16 DIAGNOSIS — G5620 Lesion of ulnar nerve, unspecified upper limb: Secondary | ICD-10-CM | POA: Diagnosis not present

## 2018-01-16 DIAGNOSIS — G720 Drug-induced myopathy: Secondary | ICD-10-CM | POA: Diagnosis not present

## 2018-01-16 DIAGNOSIS — M5412 Radiculopathy, cervical region: Secondary | ICD-10-CM | POA: Diagnosis not present

## 2018-01-16 DIAGNOSIS — M545 Low back pain: Secondary | ICD-10-CM | POA: Diagnosis not present

## 2018-01-16 DIAGNOSIS — M5417 Radiculopathy, lumbosacral region: Secondary | ICD-10-CM | POA: Diagnosis not present

## 2018-02-10 DIAGNOSIS — R918 Other nonspecific abnormal finding of lung field: Secondary | ICD-10-CM | POA: Diagnosis not present

## 2018-02-10 DIAGNOSIS — Z8042 Family history of malignant neoplasm of prostate: Secondary | ICD-10-CM | POA: Diagnosis not present

## 2018-02-10 DIAGNOSIS — N401 Enlarged prostate with lower urinary tract symptoms: Secondary | ICD-10-CM | POA: Diagnosis not present

## 2018-02-10 DIAGNOSIS — R972 Elevated prostate specific antigen [PSA]: Secondary | ICD-10-CM | POA: Diagnosis not present

## 2018-02-13 DIAGNOSIS — R202 Paresthesia of skin: Secondary | ICD-10-CM | POA: Diagnosis not present

## 2018-02-13 DIAGNOSIS — M5417 Radiculopathy, lumbosacral region: Secondary | ICD-10-CM | POA: Diagnosis not present

## 2018-02-13 DIAGNOSIS — G4733 Obstructive sleep apnea (adult) (pediatric): Secondary | ICD-10-CM | POA: Diagnosis not present

## 2018-02-13 DIAGNOSIS — M791 Myalgia, unspecified site: Secondary | ICD-10-CM | POA: Diagnosis not present

## 2018-02-14 ENCOUNTER — Other Ambulatory Visit: Payer: Self-pay | Admitting: Family Medicine

## 2018-02-17 ENCOUNTER — Encounter: Payer: Federal, State, Local not specified - PPO | Admitting: Family Medicine

## 2018-03-08 ENCOUNTER — Other Ambulatory Visit: Payer: Self-pay | Admitting: Family Medicine

## 2018-03-08 DIAGNOSIS — Z8719 Personal history of other diseases of the digestive system: Secondary | ICD-10-CM

## 2018-03-13 DIAGNOSIS — N62 Hypertrophy of breast: Secondary | ICD-10-CM | POA: Diagnosis not present

## 2018-03-13 DIAGNOSIS — Z803 Family history of malignant neoplasm of breast: Secondary | ICD-10-CM | POA: Diagnosis not present

## 2018-03-13 DIAGNOSIS — R922 Inconclusive mammogram: Secondary | ICD-10-CM | POA: Diagnosis not present

## 2018-03-16 ENCOUNTER — Other Ambulatory Visit: Payer: Self-pay | Admitting: Family Medicine

## 2018-03-19 NOTE — Telephone Encounter (Signed)
Left detailed message on cell vm, okay per DPR.  

## 2018-03-26 DIAGNOSIS — G5790 Unspecified mononeuropathy of unspecified lower limb: Secondary | ICD-10-CM | POA: Diagnosis not present

## 2018-03-26 DIAGNOSIS — M7751 Other enthesopathy of right foot: Secondary | ICD-10-CM | POA: Diagnosis not present

## 2018-03-26 DIAGNOSIS — M67371 Transient synovitis, right ankle and foot: Secondary | ICD-10-CM | POA: Diagnosis not present

## 2018-03-26 DIAGNOSIS — M19071 Primary osteoarthritis, right ankle and foot: Secondary | ICD-10-CM | POA: Diagnosis not present

## 2018-03-26 DIAGNOSIS — M7752 Other enthesopathy of left foot: Secondary | ICD-10-CM | POA: Diagnosis not present

## 2018-03-26 DIAGNOSIS — M19072 Primary osteoarthritis, left ankle and foot: Secondary | ICD-10-CM | POA: Diagnosis not present

## 2018-03-26 DIAGNOSIS — M722 Plantar fascial fibromatosis: Secondary | ICD-10-CM | POA: Diagnosis not present

## 2018-03-27 DIAGNOSIS — G2581 Restless legs syndrome: Secondary | ICD-10-CM | POA: Diagnosis not present

## 2018-03-27 DIAGNOSIS — M5412 Radiculopathy, cervical region: Secondary | ICD-10-CM | POA: Diagnosis not present

## 2018-03-27 DIAGNOSIS — G5620 Lesion of ulnar nerve, unspecified upper limb: Secondary | ICD-10-CM | POA: Diagnosis not present

## 2018-03-27 DIAGNOSIS — M5417 Radiculopathy, lumbosacral region: Secondary | ICD-10-CM | POA: Diagnosis not present

## 2018-03-27 DIAGNOSIS — G5603 Carpal tunnel syndrome, bilateral upper limbs: Secondary | ICD-10-CM | POA: Diagnosis not present

## 2018-04-02 DIAGNOSIS — M67371 Transient synovitis, right ankle and foot: Secondary | ICD-10-CM | POA: Diagnosis not present

## 2018-04-14 DIAGNOSIS — M67371 Transient synovitis, right ankle and foot: Secondary | ICD-10-CM | POA: Diagnosis not present

## 2018-04-16 ENCOUNTER — Encounter: Payer: Self-pay | Admitting: Family Medicine

## 2018-04-16 ENCOUNTER — Ambulatory Visit (INDEPENDENT_AMBULATORY_CARE_PROVIDER_SITE_OTHER): Payer: Federal, State, Local not specified - PPO | Admitting: Family Medicine

## 2018-04-16 VITALS — BP 124/78 | HR 81 | Temp 98.7°F | Resp 16 | Ht 74.0 in | Wt 258.2 lb

## 2018-04-16 DIAGNOSIS — Z0000 Encounter for general adult medical examination without abnormal findings: Secondary | ICD-10-CM | POA: Diagnosis not present

## 2018-04-16 DIAGNOSIS — E8881 Metabolic syndrome: Secondary | ICD-10-CM

## 2018-04-16 DIAGNOSIS — M791 Myalgia, unspecified site: Secondary | ICD-10-CM | POA: Diagnosis not present

## 2018-04-16 DIAGNOSIS — E669 Obesity, unspecified: Secondary | ICD-10-CM | POA: Diagnosis not present

## 2018-04-16 DIAGNOSIS — Z789 Other specified health status: Secondary | ICD-10-CM

## 2018-04-16 DIAGNOSIS — Z807 Family history of other malignant neoplasms of lymphoid, hematopoietic and related tissues: Secondary | ICD-10-CM | POA: Diagnosis not present

## 2018-04-16 DIAGNOSIS — Z125 Encounter for screening for malignant neoplasm of prostate: Secondary | ICD-10-CM

## 2018-04-16 DIAGNOSIS — G4733 Obstructive sleep apnea (adult) (pediatric): Secondary | ICD-10-CM | POA: Diagnosis not present

## 2018-04-16 DIAGNOSIS — E7800 Pure hypercholesterolemia, unspecified: Secondary | ICD-10-CM | POA: Diagnosis not present

## 2018-04-16 DIAGNOSIS — Q385 Congenital malformations of palate, not elsewhere classified: Secondary | ICD-10-CM

## 2018-04-16 DIAGNOSIS — Q386 Other congenital malformations of mouth: Secondary | ICD-10-CM

## 2018-04-16 DIAGNOSIS — E559 Vitamin D deficiency, unspecified: Secondary | ICD-10-CM

## 2018-04-16 DIAGNOSIS — G3184 Mild cognitive impairment, so stated: Secondary | ICD-10-CM

## 2018-04-16 DIAGNOSIS — R7303 Prediabetes: Secondary | ICD-10-CM

## 2018-04-16 LAB — LIPID PANEL
CHOL/HDL RATIO: 7
Cholesterol: 281 mg/dL — ABNORMAL HIGH (ref 0–200)
HDL: 39.4 mg/dL (ref >39.00)
NONHDL: 241.96
TRIGLYCERIDES: 212 mg/dL — AB (ref 0.0–149.0)
VLDL: 42.4 mg/dL — ABNORMAL HIGH (ref 0.0–40.0)

## 2018-04-16 LAB — CBC WITH DIFFERENTIAL/PLATELET
BASOS ABS: 0 10*3/uL (ref 0.0–0.1)
Basophils Relative: 0.5 % (ref 0.0–3.0)
EOS PCT: 1 % (ref 0.0–5.0)
Eosinophils Absolute: 0.1 10*3/uL (ref 0.0–0.7)
HEMATOCRIT: 43.9 % (ref 39.0–52.0)
Hemoglobin: 14.8 g/dL (ref 13.0–17.0)
LYMPHS PCT: 24.7 % (ref 12.0–46.0)
Lymphs Abs: 2.3 10*3/uL (ref 0.7–4.0)
MCHC: 33.7 g/dL (ref 30.0–36.0)
MCV: 82.9 fl (ref 78.0–100.0)
MONOS PCT: 8.7 % (ref 3.0–12.0)
Monocytes Absolute: 0.8 10*3/uL (ref 0.1–1.0)
Neutro Abs: 6 10*3/uL (ref 1.4–7.7)
Neutrophils Relative %: 65.1 % (ref 43.0–77.0)
PLATELETS: 291 10*3/uL (ref 150.0–400.0)
RBC: 5.29 Mil/uL (ref 4.22–5.81)
RDW: 14.6 % (ref 11.5–15.5)
WBC: 9.2 10*3/uL (ref 4.0–10.5)

## 2018-04-16 LAB — LDL CHOLESTEROL, DIRECT: LDL DIRECT: 203 mg/dL

## 2018-04-16 LAB — PSA: PSA: 2.59 ng/mL (ref 0.10–4.00)

## 2018-04-16 LAB — TSH: TSH: 2.05 u[IU]/mL (ref 0.35–4.50)

## 2018-04-16 LAB — COMPREHENSIVE METABOLIC PANEL
ALT: 19 U/L (ref 0–53)
AST: 11 U/L (ref 0–37)
Albumin: 4.5 g/dL (ref 3.5–5.2)
Alkaline Phosphatase: 71 U/L (ref 39–117)
BUN: 12 mg/dL (ref 6–23)
CALCIUM: 9.7 mg/dL (ref 8.4–10.5)
CHLORIDE: 98 meq/L (ref 96–112)
CO2: 30 meq/L (ref 19–32)
Creatinine, Ser: 0.9 mg/dL (ref 0.40–1.50)
GFR: 93.54 mL/min (ref >60.00)
GLUCOSE: 93 mg/dL (ref 70–99)
Potassium: 4.1 mEq/L (ref 3.5–5.1)
Sodium: 136 mEq/L (ref 135–145)
Total Bilirubin: 0.5 mg/dL (ref 0.2–1.2)
Total Protein: 7.4 g/dL (ref 6.0–8.3)

## 2018-04-16 LAB — HEMOGLOBIN A1C: Hgb A1c MFr Bld: 6 % (ref 4.6–6.5)

## 2018-04-16 LAB — VITAMIN D 25 HYDROXY (VIT D DEFICIENCY, FRACTURES): VITD: 39.53 ng/mL (ref 30.00–100.00)
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
## 2018-04-16 NOTE — Patient Instructions (Signed)

## 2018-04-17 ENCOUNTER — Encounter: Payer: Self-pay | Admitting: Family Medicine

## 2018-04-17 ENCOUNTER — Other Ambulatory Visit: Payer: Self-pay | Admitting: Family Medicine

## 2018-04-18 ENCOUNTER — Encounter: Payer: Self-pay | Admitting: Family Medicine

## 2018-04-18 ENCOUNTER — Telehealth: Payer: Self-pay | Admitting: Family Medicine

## 2018-04-18 ENCOUNTER — Other Ambulatory Visit: Payer: Self-pay | Admitting: *Deleted

## 2018-04-18 ENCOUNTER — Other Ambulatory Visit: Payer: Self-pay | Admitting: Family Medicine

## 2018-04-18 DIAGNOSIS — Z8042 Family history of malignant neoplasm of prostate: Secondary | ICD-10-CM

## 2018-04-18 DIAGNOSIS — Z807 Family history of other malignant neoplasms of lymphoid, hematopoietic and related tissues: Secondary | ICD-10-CM

## 2018-04-18 DIAGNOSIS — Z803 Family history of malignant neoplasm of breast: Secondary | ICD-10-CM

## 2018-04-18 MED ORDER — PRAVASTATIN SODIUM 40 MG PO TABS: 40.0000 mg | ORAL_TABLET | Freq: Every day | ORAL | 1 refills | Status: DC

## 2018-04-18 NOTE — Telephone Encounter (Signed)
Pls call pt and have him make lab appt to get his genetic test for cancer (BRCA 1 and 2).-thx

## 2018-05-14 ENCOUNTER — Other Ambulatory Visit: Payer: Self-pay | Admitting: Family Medicine

## 2018-05-26 DIAGNOSIS — G4733 Obstructive sleep apnea (adult) (pediatric): Secondary | ICD-10-CM | POA: Diagnosis not present

## 2018-05-27 DIAGNOSIS — K1379 Other lesions of oral mucosa: Secondary | ICD-10-CM | POA: Diagnosis not present

## 2018-05-27 DIAGNOSIS — G4733 Obstructive sleep apnea (adult) (pediatric): Secondary | ICD-10-CM | POA: Diagnosis not present

## 2018-05-27 DIAGNOSIS — G4710 Hypersomnia, unspecified: Secondary | ICD-10-CM | POA: Diagnosis not present

## 2018-05-27 DIAGNOSIS — J343 Hypertrophy of nasal turbinates: Secondary | ICD-10-CM | POA: Diagnosis not present

## 2018-05-28 DIAGNOSIS — J342 Deviated nasal septum: Secondary | ICD-10-CM | POA: Diagnosis not present

## 2018-05-28 DIAGNOSIS — J343 Hypertrophy of nasal turbinates: Secondary | ICD-10-CM | POA: Insufficient documentation

## 2018-05-28 DIAGNOSIS — G4733 Obstructive sleep apnea (adult) (pediatric): Secondary | ICD-10-CM | POA: Diagnosis not present

## 2018-05-30 ENCOUNTER — Encounter: Payer: Self-pay | Admitting: Family Medicine

## 2018-05-30 DIAGNOSIS — R293 Abnormal posture: Secondary | ICD-10-CM | POA: Diagnosis not present

## 2018-05-30 DIAGNOSIS — M5442 Lumbago with sciatica, left side: Secondary | ICD-10-CM | POA: Diagnosis not present

## 2018-05-30 DIAGNOSIS — M62838 Other muscle spasm: Secondary | ICD-10-CM | POA: Diagnosis not present

## 2018-05-30 DIAGNOSIS — M5441 Lumbago with sciatica, right side: Secondary | ICD-10-CM | POA: Diagnosis not present

## 2018-05-31 ENCOUNTER — Encounter: Payer: Self-pay | Admitting: Family Medicine

## 2018-06-02 DIAGNOSIS — M62838 Other muscle spasm: Secondary | ICD-10-CM | POA: Diagnosis not present

## 2018-06-02 DIAGNOSIS — R293 Abnormal posture: Secondary | ICD-10-CM | POA: Diagnosis not present

## 2018-06-02 DIAGNOSIS — M5441 Lumbago with sciatica, right side: Secondary | ICD-10-CM | POA: Diagnosis not present

## 2018-06-02 DIAGNOSIS — M5442 Lumbago with sciatica, left side: Secondary | ICD-10-CM | POA: Diagnosis not present

## 2018-06-03 ENCOUNTER — Other Ambulatory Visit (INDEPENDENT_AMBULATORY_CARE_PROVIDER_SITE_OTHER): Payer: Federal, State, Local not specified - PPO

## 2018-06-03 DIAGNOSIS — Z8042 Family history of malignant neoplasm of prostate: Secondary | ICD-10-CM

## 2018-06-03 DIAGNOSIS — Z803 Family history of malignant neoplasm of breast: Secondary | ICD-10-CM | POA: Diagnosis not present

## 2018-06-03 DIAGNOSIS — Z807 Family history of other malignant neoplasms of lymphoid, hematopoietic and related tissues: Secondary | ICD-10-CM

## 2018-06-05 DIAGNOSIS — M5412 Radiculopathy, cervical region: Secondary | ICD-10-CM | POA: Diagnosis not present

## 2018-06-05 DIAGNOSIS — R202 Paresthesia of skin: Secondary | ICD-10-CM | POA: Diagnosis not present

## 2018-06-05 DIAGNOSIS — M5417 Radiculopathy, lumbosacral region: Secondary | ICD-10-CM | POA: Diagnosis not present

## 2018-06-05 DIAGNOSIS — G5603 Carpal tunnel syndrome, bilateral upper limbs: Secondary | ICD-10-CM | POA: Diagnosis not present

## 2018-06-05 DIAGNOSIS — Z809 Family history of malignant neoplasm, unspecified: Secondary | ICD-10-CM

## 2018-06-05 HISTORY — DX: Family history of malignant neoplasm, unspecified: Z80.9

## 2018-06-09 ENCOUNTER — Encounter: Payer: Self-pay | Admitting: *Deleted

## 2018-06-10 LAB — BRCAVANTAGE, COMPREHENSIVE

## 2018-06-11 DIAGNOSIS — M5441 Lumbago with sciatica, right side: Secondary | ICD-10-CM | POA: Diagnosis not present

## 2018-06-11 DIAGNOSIS — R293 Abnormal posture: Secondary | ICD-10-CM | POA: Diagnosis not present

## 2018-06-11 DIAGNOSIS — M5442 Lumbago with sciatica, left side: Secondary | ICD-10-CM | POA: Diagnosis not present

## 2018-06-11 DIAGNOSIS — M62838 Other muscle spasm: Secondary | ICD-10-CM | POA: Diagnosis not present

## 2018-06-12 DIAGNOSIS — K080 Exfoliation of teeth due to systemic causes: Secondary | ICD-10-CM | POA: Diagnosis not present

## 2018-06-13 DIAGNOSIS — R293 Abnormal posture: Secondary | ICD-10-CM | POA: Diagnosis not present

## 2018-06-13 DIAGNOSIS — M5441 Lumbago with sciatica, right side: Secondary | ICD-10-CM | POA: Diagnosis not present

## 2018-06-13 DIAGNOSIS — M62838 Other muscle spasm: Secondary | ICD-10-CM | POA: Diagnosis not present

## 2018-06-13 DIAGNOSIS — M5442 Lumbago with sciatica, left side: Secondary | ICD-10-CM | POA: Diagnosis not present

## 2018-06-16 DIAGNOSIS — R293 Abnormal posture: Secondary | ICD-10-CM | POA: Diagnosis not present

## 2018-06-16 DIAGNOSIS — M62838 Other muscle spasm: Secondary | ICD-10-CM | POA: Diagnosis not present

## 2018-06-16 DIAGNOSIS — M5441 Lumbago with sciatica, right side: Secondary | ICD-10-CM | POA: Diagnosis not present

## 2018-06-16 DIAGNOSIS — M5442 Lumbago with sciatica, left side: Secondary | ICD-10-CM | POA: Diagnosis not present

## 2018-06-17 NOTE — Telephone Encounter (Signed)
Please advise. Thanks.  

## 2018-06-18 DIAGNOSIS — M62838 Other muscle spasm: Secondary | ICD-10-CM | POA: Diagnosis not present

## 2018-06-18 DIAGNOSIS — M5442 Lumbago with sciatica, left side: Secondary | ICD-10-CM | POA: Diagnosis not present

## 2018-06-18 DIAGNOSIS — M5441 Lumbago with sciatica, right side: Secondary | ICD-10-CM | POA: Diagnosis not present

## 2018-06-18 DIAGNOSIS — R293 Abnormal posture: Secondary | ICD-10-CM | POA: Diagnosis not present

## 2018-06-20 DIAGNOSIS — R293 Abnormal posture: Secondary | ICD-10-CM | POA: Diagnosis not present

## 2018-06-20 DIAGNOSIS — M5441 Lumbago with sciatica, right side: Secondary | ICD-10-CM | POA: Diagnosis not present

## 2018-06-20 DIAGNOSIS — M5442 Lumbago with sciatica, left side: Secondary | ICD-10-CM | POA: Diagnosis not present

## 2018-06-20 DIAGNOSIS — M62838 Other muscle spasm: Secondary | ICD-10-CM | POA: Diagnosis not present

## 2018-06-23 ENCOUNTER — Telehealth: Payer: Self-pay | Admitting: Family Medicine

## 2018-06-23 DIAGNOSIS — M62838 Other muscle spasm: Secondary | ICD-10-CM | POA: Diagnosis not present

## 2018-06-23 DIAGNOSIS — Z803 Family history of malignant neoplasm of breast: Secondary | ICD-10-CM

## 2018-06-23 DIAGNOSIS — M5442 Lumbago with sciatica, left side: Secondary | ICD-10-CM | POA: Diagnosis not present

## 2018-06-23 DIAGNOSIS — R293 Abnormal posture: Secondary | ICD-10-CM | POA: Diagnosis not present

## 2018-06-23 DIAGNOSIS — M5441 Lumbago with sciatica, right side: Secondary | ICD-10-CM | POA: Diagnosis not present

## 2018-06-23 DIAGNOSIS — Z807 Family history of other malignant neoplasms of lymphoid, hematopoietic and related tissues: Secondary | ICD-10-CM

## 2018-06-23 DIAGNOSIS — Z8042 Family history of malignant neoplasm of prostate: Secondary | ICD-10-CM

## 2018-06-23 NOTE — Telephone Encounter (Signed)
pls notify pt that his insurer approved the genetic test that I ordered. He has to come in for another lab visit for redraw since they did not run the test off of his initial blood sample. I'll put the PA papers on your desk. New order is entered.

## 2018-06-24 NOTE — Telephone Encounter (Signed)
LMOM for patient to call back to schedule lab appointment only.  His insurance finally approved lab test patient was wanting.

## 2018-06-25 DIAGNOSIS — R293 Abnormal posture: Secondary | ICD-10-CM | POA: Diagnosis not present

## 2018-06-25 DIAGNOSIS — M62838 Other muscle spasm: Secondary | ICD-10-CM | POA: Diagnosis not present

## 2018-06-25 DIAGNOSIS — M5442 Lumbago with sciatica, left side: Secondary | ICD-10-CM | POA: Diagnosis not present

## 2018-06-25 DIAGNOSIS — M5441 Lumbago with sciatica, right side: Secondary | ICD-10-CM | POA: Diagnosis not present

## 2018-06-27 DIAGNOSIS — M5442 Lumbago with sciatica, left side: Secondary | ICD-10-CM | POA: Diagnosis not present

## 2018-06-27 DIAGNOSIS — M62838 Other muscle spasm: Secondary | ICD-10-CM | POA: Diagnosis not present

## 2018-06-27 DIAGNOSIS — M5441 Lumbago with sciatica, right side: Secondary | ICD-10-CM | POA: Diagnosis not present

## 2018-06-27 DIAGNOSIS — R293 Abnormal posture: Secondary | ICD-10-CM | POA: Diagnosis not present

## 2018-06-30 DIAGNOSIS — M62838 Other muscle spasm: Secondary | ICD-10-CM | POA: Diagnosis not present

## 2018-06-30 DIAGNOSIS — M5441 Lumbago with sciatica, right side: Secondary | ICD-10-CM | POA: Diagnosis not present

## 2018-06-30 DIAGNOSIS — R293 Abnormal posture: Secondary | ICD-10-CM | POA: Diagnosis not present

## 2018-06-30 DIAGNOSIS — M5442 Lumbago with sciatica, left side: Secondary | ICD-10-CM | POA: Diagnosis not present

## 2018-07-02 ENCOUNTER — Other Ambulatory Visit (INDEPENDENT_AMBULATORY_CARE_PROVIDER_SITE_OTHER): Payer: Federal, State, Local not specified - PPO

## 2018-07-02 DIAGNOSIS — R293 Abnormal posture: Secondary | ICD-10-CM | POA: Diagnosis not present

## 2018-07-02 DIAGNOSIS — Z8042 Family history of malignant neoplasm of prostate: Secondary | ICD-10-CM

## 2018-07-02 DIAGNOSIS — M5442 Lumbago with sciatica, left side: Secondary | ICD-10-CM | POA: Diagnosis not present

## 2018-07-02 DIAGNOSIS — Z803 Family history of malignant neoplasm of breast: Secondary | ICD-10-CM

## 2018-07-02 DIAGNOSIS — Z807 Family history of other malignant neoplasms of lymphoid, hematopoietic and related tissues: Secondary | ICD-10-CM

## 2018-07-02 DIAGNOSIS — M62838 Other muscle spasm: Secondary | ICD-10-CM | POA: Diagnosis not present

## 2018-07-02 DIAGNOSIS — M5441 Lumbago with sciatica, right side: Secondary | ICD-10-CM | POA: Diagnosis not present

## 2018-07-03 DIAGNOSIS — M5417 Radiculopathy, lumbosacral region: Secondary | ICD-10-CM | POA: Diagnosis not present

## 2018-07-03 DIAGNOSIS — F419 Anxiety disorder, unspecified: Secondary | ICD-10-CM | POA: Diagnosis not present

## 2018-07-03 DIAGNOSIS — G4719 Other hypersomnia: Secondary | ICD-10-CM | POA: Diagnosis not present

## 2018-07-03 DIAGNOSIS — R202 Paresthesia of skin: Secondary | ICD-10-CM | POA: Diagnosis not present

## 2018-07-04 DIAGNOSIS — R293 Abnormal posture: Secondary | ICD-10-CM | POA: Diagnosis not present

## 2018-07-04 DIAGNOSIS — M62838 Other muscle spasm: Secondary | ICD-10-CM | POA: Diagnosis not present

## 2018-07-04 DIAGNOSIS — M5441 Lumbago with sciatica, right side: Secondary | ICD-10-CM | POA: Diagnosis not present

## 2018-07-04 DIAGNOSIS — M5442 Lumbago with sciatica, left side: Secondary | ICD-10-CM | POA: Diagnosis not present

## 2018-07-08 DIAGNOSIS — R293 Abnormal posture: Secondary | ICD-10-CM | POA: Diagnosis not present

## 2018-07-08 DIAGNOSIS — M5441 Lumbago with sciatica, right side: Secondary | ICD-10-CM | POA: Diagnosis not present

## 2018-07-08 DIAGNOSIS — M62838 Other muscle spasm: Secondary | ICD-10-CM | POA: Diagnosis not present

## 2018-07-08 DIAGNOSIS — M5442 Lumbago with sciatica, left side: Secondary | ICD-10-CM | POA: Diagnosis not present

## 2018-07-09 DIAGNOSIS — R293 Abnormal posture: Secondary | ICD-10-CM | POA: Diagnosis not present

## 2018-07-09 DIAGNOSIS — M62838 Other muscle spasm: Secondary | ICD-10-CM | POA: Diagnosis not present

## 2018-07-09 DIAGNOSIS — M5442 Lumbago with sciatica, left side: Secondary | ICD-10-CM | POA: Diagnosis not present

## 2018-07-09 DIAGNOSIS — M5441 Lumbago with sciatica, right side: Secondary | ICD-10-CM | POA: Diagnosis not present

## 2018-07-13 LAB — BRCAVANTAGE, COMPREHENSIVE
BRCA1 DEL/DUP (BRCAVANTAGE): NEGATIVE
BRCA1 Del/Dup Interp: NOT DETECTED
BRCA2 Del/Dup Interp: NOT DETECTED
BRCA2 Del/Dup: NEGATIVE
BRCA2 SEQ INTERP (BRCAVANTAGE): NOT DETECTED
BRCA2 Sequencing: NEGATIVE

## 2018-07-14 ENCOUNTER — Encounter: Payer: Self-pay | Admitting: Family Medicine

## 2018-07-14 ENCOUNTER — Encounter: Payer: Self-pay | Admitting: *Deleted

## 2018-07-14 DIAGNOSIS — M62838 Other muscle spasm: Secondary | ICD-10-CM | POA: Diagnosis not present

## 2018-07-14 DIAGNOSIS — M5442 Lumbago with sciatica, left side: Secondary | ICD-10-CM | POA: Diagnosis not present

## 2018-07-14 DIAGNOSIS — M5441 Lumbago with sciatica, right side: Secondary | ICD-10-CM | POA: Diagnosis not present

## 2018-07-14 DIAGNOSIS — R293 Abnormal posture: Secondary | ICD-10-CM | POA: Diagnosis not present

## 2018-07-15 DIAGNOSIS — J019 Acute sinusitis, unspecified: Secondary | ICD-10-CM | POA: Diagnosis not present

## 2018-07-15 DIAGNOSIS — E785 Hyperlipidemia, unspecified: Secondary | ICD-10-CM | POA: Diagnosis not present

## 2018-07-15 DIAGNOSIS — B351 Tinea unguium: Secondary | ICD-10-CM | POA: Diagnosis not present

## 2018-07-15 DIAGNOSIS — J309 Allergic rhinitis, unspecified: Secondary | ICD-10-CM | POA: Diagnosis not present

## 2018-07-16 DIAGNOSIS — R293 Abnormal posture: Secondary | ICD-10-CM | POA: Diagnosis not present

## 2018-07-16 DIAGNOSIS — M5442 Lumbago with sciatica, left side: Secondary | ICD-10-CM | POA: Diagnosis not present

## 2018-07-16 DIAGNOSIS — E049 Nontoxic goiter, unspecified: Secondary | ICD-10-CM | POA: Diagnosis not present

## 2018-07-16 DIAGNOSIS — M62838 Other muscle spasm: Secondary | ICD-10-CM | POA: Diagnosis not present

## 2018-07-16 DIAGNOSIS — Z0000 Encounter for general adult medical examination without abnormal findings: Secondary | ICD-10-CM | POA: Diagnosis not present

## 2018-07-16 DIAGNOSIS — E785 Hyperlipidemia, unspecified: Secondary | ICD-10-CM | POA: Diagnosis not present

## 2018-07-16 DIAGNOSIS — M5441 Lumbago with sciatica, right side: Secondary | ICD-10-CM | POA: Diagnosis not present

## 2018-07-18 DIAGNOSIS — M5441 Lumbago with sciatica, right side: Secondary | ICD-10-CM | POA: Diagnosis not present

## 2018-07-18 DIAGNOSIS — M62838 Other muscle spasm: Secondary | ICD-10-CM | POA: Diagnosis not present

## 2018-07-18 DIAGNOSIS — M5442 Lumbago with sciatica, left side: Secondary | ICD-10-CM | POA: Diagnosis not present

## 2018-07-18 DIAGNOSIS — R293 Abnormal posture: Secondary | ICD-10-CM | POA: Diagnosis not present

## 2018-07-21 DIAGNOSIS — M62838 Other muscle spasm: Secondary | ICD-10-CM | POA: Diagnosis not present

## 2018-07-21 DIAGNOSIS — M5442 Lumbago with sciatica, left side: Secondary | ICD-10-CM | POA: Diagnosis not present

## 2018-07-21 DIAGNOSIS — R293 Abnormal posture: Secondary | ICD-10-CM | POA: Diagnosis not present

## 2018-07-21 DIAGNOSIS — M5441 Lumbago with sciatica, right side: Secondary | ICD-10-CM | POA: Diagnosis not present

## 2018-07-28 DIAGNOSIS — Z0189 Encounter for other specified special examinations: Secondary | ICD-10-CM | POA: Diagnosis not present

## 2018-07-28 DIAGNOSIS — Z136 Encounter for screening for cardiovascular disorders: Secondary | ICD-10-CM | POA: Diagnosis not present

## 2018-07-29 DIAGNOSIS — K080 Exfoliation of teeth due to systemic causes: Secondary | ICD-10-CM | POA: Diagnosis not present

## 2018-08-01 DIAGNOSIS — E010 Iodine-deficiency related diffuse (endemic) goiter: Secondary | ICD-10-CM | POA: Diagnosis not present

## 2018-08-06 DIAGNOSIS — N400 Enlarged prostate without lower urinary tract symptoms: Secondary | ICD-10-CM | POA: Diagnosis not present

## 2018-08-06 DIAGNOSIS — Z8042 Family history of malignant neoplasm of prostate: Secondary | ICD-10-CM | POA: Diagnosis not present

## 2018-08-25 DIAGNOSIS — R972 Elevated prostate specific antigen [PSA]: Secondary | ICD-10-CM | POA: Diagnosis not present

## 2018-08-25 DIAGNOSIS — Z136 Encounter for screening for cardiovascular disorders: Secondary | ICD-10-CM | POA: Diagnosis not present

## 2018-08-29 ENCOUNTER — Encounter: Payer: Self-pay | Admitting: *Deleted

## 2018-08-29 ENCOUNTER — Telehealth: Payer: Self-pay | Admitting: *Deleted

## 2018-08-29 NOTE — Telephone Encounter (Signed)
Copied from [place] [id number]. Topic: Quick Communication - See Telephone Encounter >> Aug 28, 2018  2:25 PM Percell Belt A wrote: CRM for notification. See Telephone encounter for: 08/28/18.  Pt called in is trying to file a [facility] claim for his back.  He is needs his weight put down on our letter head that states Date and weight starting in 2013 and every visit there after.  He is filing a claim to prove that his weight gain is do to back injury which has not caused sleep apnea  Best call back number [phone]

## 2018-08-29 NOTE — Telephone Encounter (Signed)
Please advise. Thanks.   If okay I will put letter together for you to sign.

## 2018-08-29 NOTE — Telephone Encounter (Signed)
Letter printed, signed and put up front for pick up.  Left message advising pt that letter is ready for p/u.

## 2018-08-29 NOTE — Telephone Encounter (Signed)
The info he is requesting is fine to give.-thx

## 2018-09-01 ENCOUNTER — Other Ambulatory Visit: Payer: Self-pay | Admitting: Family Medicine

## 2018-09-01 DIAGNOSIS — Z8719 Personal history of other diseases of the digestive system: Secondary | ICD-10-CM

## 2018-09-04 DIAGNOSIS — R52 Pain, unspecified: Secondary | ICD-10-CM | POA: Diagnosis not present

## 2018-09-04 DIAGNOSIS — Z79899 Other long term (current) drug therapy: Secondary | ICD-10-CM | POA: Diagnosis not present

## 2018-09-04 DIAGNOSIS — Z5181 Encounter for therapeutic drug level monitoring: Secondary | ICD-10-CM | POA: Diagnosis not present

## 2018-09-18 DIAGNOSIS — G8929 Other chronic pain: Secondary | ICD-10-CM | POA: Diagnosis not present

## 2018-09-18 DIAGNOSIS — E785 Hyperlipidemia, unspecified: Secondary | ICD-10-CM | POA: Diagnosis not present

## 2018-09-18 DIAGNOSIS — M549 Dorsalgia, unspecified: Secondary | ICD-10-CM | POA: Diagnosis not present

## 2018-09-26 DIAGNOSIS — R0602 Shortness of breath: Secondary | ICD-10-CM | POA: Diagnosis not present

## 2018-09-26 DIAGNOSIS — R0789 Other chest pain: Secondary | ICD-10-CM | POA: Diagnosis not present

## 2018-09-26 DIAGNOSIS — F419 Anxiety disorder, unspecified: Secondary | ICD-10-CM | POA: Diagnosis not present

## 2018-10-07 DIAGNOSIS — R419 Unspecified symptoms and signs involving cognitive functions and awareness: Secondary | ICD-10-CM | POA: Diagnosis not present

## 2018-10-07 DIAGNOSIS — J069 Acute upper respiratory infection, unspecified: Secondary | ICD-10-CM | POA: Diagnosis not present

## 2018-10-07 DIAGNOSIS — R0609 Other forms of dyspnea: Secondary | ICD-10-CM | POA: Diagnosis not present

## 2018-10-09 DIAGNOSIS — M5417 Radiculopathy, lumbosacral region: Secondary | ICD-10-CM | POA: Diagnosis not present

## 2018-10-09 DIAGNOSIS — R202 Paresthesia of skin: Secondary | ICD-10-CM | POA: Diagnosis not present

## 2018-10-09 DIAGNOSIS — M5412 Radiculopathy, cervical region: Secondary | ICD-10-CM | POA: Diagnosis not present

## 2018-10-09 DIAGNOSIS — F419 Anxiety disorder, unspecified: Secondary | ICD-10-CM | POA: Diagnosis not present

## 2018-10-09 DIAGNOSIS — F5101 Primary insomnia: Secondary | ICD-10-CM | POA: Diagnosis not present

## 2018-10-11 ENCOUNTER — Encounter (HOSPITAL_COMMUNITY): Payer: Self-pay

## 2018-10-11 ENCOUNTER — Ambulatory Visit (HOSPITAL_COMMUNITY)
Admission: EM | Admit: 2018-10-11 | Discharge: 2018-10-11 | Disposition: A | Discharge status: Home / Self Care | Payer: Federal, State, Local not specified - PPO | Attending: Internal Medicine | Admitting: Internal Medicine

## 2018-10-11 DIAGNOSIS — J0120 Acute ethmoidal sinusitis, unspecified: Secondary | ICD-10-CM | POA: Diagnosis not present

## 2018-10-11 MED ORDER — ACETAMINOPHEN 325 MG PO TABS
975.0000 mg | ORAL_TABLET | Freq: Once | ORAL | Status: AC
  Administered 2018-10-11: 975 mg via ORAL

## 2018-10-11 MED ORDER — AMOXICILLIN-POT CLAVULANATE 875-125 MG PO TABS: 1.0000 | ORAL_TABLET | Freq: Two times a day (BID) | ORAL | 0 refills | Status: DC

## 2018-10-11 MED ORDER — ACETAMINOPHEN 325 MG PO TABS
ORAL_TABLET | ORAL | Status: AC
  Filled 2018-10-11: qty 3

## 2018-10-11 NOTE — Discharge Instructions (Signed)
Try Flonase nasal spray 2 sprays each nostril ones a day for 7 days if the saline rinses is not enough to open your nose passages.  You may take Tylenol 500 mg 2 every 6 hours for pain. Do not take Ibuprofen type medications while on the prednisone.   Avoid dairy and antihistamines like Claritin or Zyrtec which will thicken the secretions.   If you get worse in 48 hours, you may need to see Ear Nose and Throat Dr, so follow up with your primary care Dr for coordination of care.

## 2018-10-11 NOTE — ED Triage Notes (Signed)
Pt present sinus pressure with headache that started a week.  Pt states he is getting no relief in his sinus cavity
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
## 2018-10-15 ENCOUNTER — Ambulatory Visit: Payer: Self-pay | Admitting: *Deleted

## 2018-10-15 ENCOUNTER — Ambulatory Visit: Payer: Federal, State, Local not specified - PPO | Admitting: Family Medicine

## 2018-10-15 ENCOUNTER — Encounter: Payer: Self-pay | Admitting: Family Medicine

## 2018-10-15 VITALS — BP 132/78 | HR 93 | Temp 97.9°F | Resp 16 | Ht 74.0 in | Wt 247.0 lb

## 2018-10-15 DIAGNOSIS — H6983 Other specified disorders of Eustachian tube, bilateral: Secondary | ICD-10-CM | POA: Diagnosis not present

## 2018-10-15 MED ORDER — FLUTICASONE PROPIONATE 50 MCG/ACT NA SUSP: NASAL | 3 refills | Status: DC

## 2018-10-15 NOTE — Telephone Encounter (Signed)
Message from Valla Leaver sent at 10/15/2018 4:26 PM EST   Summary: nasal spray question   Clarify how to take nasal spray            Attempted to call pt but no answer at this time.Mailbox is full and unable to accept messages at this time.
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
## 2018-10-15 NOTE — Patient Instructions (Signed)
Use saline nasal SPRAY 4 quirts into each nostril w/out sniffing.  Blow this out forcefully after waiting 5 seconds. Do this 3 days a week.  Restart fluticasone (flonase) nasal spray: 2 sprays each nostril once a day.

## 2018-10-16 ENCOUNTER — Telehealth: Payer: Self-pay | Admitting: *Deleted

## 2018-10-16 DIAGNOSIS — R0602 Shortness of breath: Secondary | ICD-10-CM | POA: Diagnosis not present

## 2018-10-16 NOTE — Telephone Encounter (Signed)
Please advise. Thanks.  

## 2018-10-16 NOTE — Telephone Encounter (Signed)
Message from pt: I'm having trouble catching good deep breaths and could not sleep last night due to not being comfortable breathing. Any suggestions?  Please advise. Thanks.

## 2018-10-16 NOTE — Telephone Encounter (Signed)
This is not a question that is appropriate for me to answer until I see pt in the clinic and talk to him and examine him. Sorry.-thx

## 2018-10-17 NOTE — Telephone Encounter (Signed)
MyChart message read.

## 2018-10-17 NOTE — Telephone Encounter (Signed)
MyChart message sent to pt

## 2018-10-22 DIAGNOSIS — G8929 Other chronic pain: Secondary | ICD-10-CM | POA: Diagnosis not present

## 2018-10-22 DIAGNOSIS — F329 Major depressive disorder, single episode, unspecified: Secondary | ICD-10-CM | POA: Diagnosis not present

## 2018-11-10 DIAGNOSIS — M62830 Muscle spasm of back: Secondary | ICD-10-CM | POA: Diagnosis not present

## 2018-11-10 DIAGNOSIS — M5441 Lumbago with sciatica, right side: Secondary | ICD-10-CM | POA: Diagnosis not present

## 2018-11-10 DIAGNOSIS — R293 Abnormal posture: Secondary | ICD-10-CM | POA: Diagnosis not present

## 2018-11-10 DIAGNOSIS — M5442 Lumbago with sciatica, left side: Secondary | ICD-10-CM | POA: Diagnosis not present

## 2018-11-21 DIAGNOSIS — G4733 Obstructive sleep apnea (adult) (pediatric): Secondary | ICD-10-CM | POA: Diagnosis not present

## 2018-11-21 DIAGNOSIS — F419 Anxiety disorder, unspecified: Secondary | ICD-10-CM | POA: Diagnosis not present

## 2018-11-21 DIAGNOSIS — F4310 Post-traumatic stress disorder, unspecified: Secondary | ICD-10-CM | POA: Diagnosis not present

## 2018-11-26 ENCOUNTER — Telehealth (HOSPITAL_COMMUNITY): Payer: Self-pay | Admitting: Psychiatry

## 2018-11-26 DIAGNOSIS — M5441 Lumbago with sciatica, right side: Secondary | ICD-10-CM | POA: Diagnosis not present

## 2018-11-26 DIAGNOSIS — M5442 Lumbago with sciatica, left side: Secondary | ICD-10-CM | POA: Diagnosis not present

## 2018-11-26 DIAGNOSIS — R293 Abnormal posture: Secondary | ICD-10-CM | POA: Diagnosis not present

## 2018-11-26 DIAGNOSIS — M62830 Muscle spasm of back: Secondary | ICD-10-CM | POA: Diagnosis not present

## 2018-11-27 DIAGNOSIS — G4733 Obstructive sleep apnea (adult) (pediatric): Secondary | ICD-10-CM | POA: Diagnosis not present

## 2018-11-28 DIAGNOSIS — R0609 Other forms of dyspnea: Secondary | ICD-10-CM | POA: Diagnosis not present

## 2018-12-02 DIAGNOSIS — G4709 Other insomnia: Secondary | ICD-10-CM | POA: Diagnosis not present

## 2018-12-02 DIAGNOSIS — G4733 Obstructive sleep apnea (adult) (pediatric): Secondary | ICD-10-CM | POA: Diagnosis not present

## 2018-12-02 DIAGNOSIS — F419 Anxiety disorder, unspecified: Secondary | ICD-10-CM | POA: Diagnosis not present

## 2018-12-02 DIAGNOSIS — F4310 Post-traumatic stress disorder, unspecified: Secondary | ICD-10-CM | POA: Diagnosis not present

## 2018-12-10 DIAGNOSIS — G8929 Other chronic pain: Secondary | ICD-10-CM | POA: Diagnosis not present

## 2018-12-11 DIAGNOSIS — G8929 Other chronic pain: Secondary | ICD-10-CM | POA: Diagnosis not present

## 2018-12-17 DIAGNOSIS — Z1329 Encounter for screening for other suspected endocrine disorder: Secondary | ICD-10-CM | POA: Diagnosis not present

## 2018-12-17 DIAGNOSIS — R972 Elevated prostate specific antigen [PSA]: Secondary | ICD-10-CM | POA: Diagnosis not present

## 2018-12-25 DIAGNOSIS — H04123 Dry eye syndrome of bilateral lacrimal glands: Secondary | ICD-10-CM | POA: Diagnosis not present

## 2018-12-25 DIAGNOSIS — Z9889 Other specified postprocedural states: Secondary | ICD-10-CM | POA: Diagnosis not present

## 2018-12-25 DIAGNOSIS — H524 Presbyopia: Secondary | ICD-10-CM | POA: Diagnosis not present

## 2018-12-26 DIAGNOSIS — C61 Malignant neoplasm of prostate: Secondary | ICD-10-CM | POA: Diagnosis not present

## 2018-12-26 DIAGNOSIS — Z86010 Personal history of colonic polyps: Secondary | ICD-10-CM | POA: Diagnosis not present

## 2018-12-26 DIAGNOSIS — Z9989 Dependence on other enabling machines and devices: Secondary | ICD-10-CM | POA: Diagnosis not present

## 2018-12-26 DIAGNOSIS — G4730 Sleep apnea, unspecified: Secondary | ICD-10-CM | POA: Diagnosis not present

## 2018-12-29 DIAGNOSIS — M5442 Lumbago with sciatica, left side: Secondary | ICD-10-CM | POA: Diagnosis not present

## 2018-12-29 DIAGNOSIS — M62830 Muscle spasm of back: Secondary | ICD-10-CM | POA: Diagnosis not present

## 2018-12-29 DIAGNOSIS — M5441 Lumbago with sciatica, right side: Secondary | ICD-10-CM | POA: Diagnosis not present

## 2018-12-29 DIAGNOSIS — R293 Abnormal posture: Secondary | ICD-10-CM | POA: Diagnosis not present

## 2018-12-31 DIAGNOSIS — F4310 Post-traumatic stress disorder, unspecified: Secondary | ICD-10-CM | POA: Diagnosis not present

## 2018-12-31 DIAGNOSIS — G4730 Sleep apnea, unspecified: Secondary | ICD-10-CM | POA: Diagnosis not present

## 2018-12-31 DIAGNOSIS — M7918 Myalgia, other site: Secondary | ICD-10-CM | POA: Diagnosis not present

## 2018-12-31 DIAGNOSIS — G5710 Meralgia paresthetica, unspecified lower limb: Secondary | ICD-10-CM | POA: Diagnosis not present

## 2019-01-06 DIAGNOSIS — R319 Hematuria, unspecified: Secondary | ICD-10-CM | POA: Diagnosis not present

## 2019-01-14 DIAGNOSIS — F419 Anxiety disorder, unspecified: Secondary | ICD-10-CM | POA: Diagnosis not present

## 2019-01-14 DIAGNOSIS — Z136 Encounter for screening for cardiovascular disorders: Secondary | ICD-10-CM | POA: Diagnosis not present

## 2019-01-21 DIAGNOSIS — Z8042 Family history of malignant neoplasm of prostate: Secondary | ICD-10-CM | POA: Diagnosis not present

## 2019-01-21 DIAGNOSIS — Z803 Family history of malignant neoplasm of breast: Secondary | ICD-10-CM | POA: Diagnosis not present

## 2019-01-21 DIAGNOSIS — C61 Malignant neoplasm of prostate: Secondary | ICD-10-CM | POA: Diagnosis not present

## 2019-01-21 DIAGNOSIS — Z1501 Genetic susceptibility to malignant neoplasm of breast: Secondary | ICD-10-CM | POA: Diagnosis not present

## 2019-01-23 DIAGNOSIS — G63 Polyneuropathy in diseases classified elsewhere: Secondary | ICD-10-CM | POA: Diagnosis not present

## 2019-01-23 DIAGNOSIS — G4730 Sleep apnea, unspecified: Secondary | ICD-10-CM | POA: Diagnosis not present

## 2019-01-23 DIAGNOSIS — C61 Malignant neoplasm of prostate: Secondary | ICD-10-CM | POA: Diagnosis not present

## 2019-01-23 DIAGNOSIS — E785 Hyperlipidemia, unspecified: Secondary | ICD-10-CM | POA: Diagnosis not present

## 2019-01-25 ENCOUNTER — Encounter: Payer: Self-pay | Admitting: Family Medicine

## 2019-01-30 ENCOUNTER — Telehealth: Payer: Self-pay | Admitting: Genetic Counselor

## 2019-01-30 DIAGNOSIS — Z8042 Family history of malignant neoplasm of prostate: Secondary | ICD-10-CM | POA: Insufficient documentation

## 2019-01-30 NOTE — Telephone Encounter (Signed)
Received an urgent genetics referral from the [facility] for +brca. Pt has been cld and scheduled to see Roma Kayser on 3/30 at 1pm. Pt aware to arrive 15 minutes early.

## 2019-02-02 ENCOUNTER — Encounter: Payer: Self-pay | Admitting: Genetic Counselor

## 2019-02-02 ENCOUNTER — Ambulatory Visit: Payer: Federal, State, Local not specified - PPO | Admitting: Genetic Counselor

## 2019-02-02 ENCOUNTER — Other Ambulatory Visit: Payer: Self-pay

## 2019-02-02 ENCOUNTER — Encounter: Payer: Federal, State, Local not specified - PPO | Admitting: Genetic Counselor

## 2019-02-02 ENCOUNTER — Other Ambulatory Visit: Payer: Federal, State, Local not specified - PPO

## 2019-02-02 DIAGNOSIS — Z8042 Family history of malignant neoplasm of prostate: Secondary | ICD-10-CM

## 2019-02-02 DIAGNOSIS — Z803 Family history of malignant neoplasm of breast: Secondary | ICD-10-CM

## 2019-02-02 DIAGNOSIS — C61 Malignant neoplasm of prostate: Secondary | ICD-10-CM

## 2019-02-02 NOTE — Progress Notes (Signed)
REFERRING PROVIDER: Tammi Sou, MD [address] [place], [place] [address]  PRIMARY PROVIDER:  Tammi Sou, MD  PRIMARY REASON FOR VISIT:  1. Prostate cancer ([place])   2. Family history of prostate cancer   3. Family history of breast cancer      HISTORY OF PRESENT ILLNESS:   Mr. Stepanek, a 53 y.o. male, was seen for a [place] cancer genetics WebEx consultation at the request of Dr. Anitra Lauth due to a personal and family history of cancer.  Mr. Prout presents to clinic today to discuss the possibility of a hereditary predisposition to cancer, genetic testing, and to further clarify his future cancer risks, as well as potential cancer risks for family members.   In March 2020, at the age of 49, Mr. Sprecher was diagnosed with cancer of the prostate. The treatment plan is not fully confirmed at this time.      CANCER HISTORY:   No history exists.      Past Medical History:  Diagnosis Date   Atypical chest pain 05/2010   s/p snow ski accident; cardiology did stress echo and this was normal.   Colon cancer screening    +FIT 11/2015.  F/u colonoscopy showed sigmoid diverticulosis, adenom polyp-->recall 3 yrs ([facility]).   Dyspnea 11/2011   Spontaneously resolved.  Unclear etiology; w/u neg as of 12/2010 with methacholine challenge and then possibly cpst still to be done by Dr. Chase Caller.   Family history of breast cancer    Family history of cancer 06/2018   BRCA testing showed one pathogenic variant of BRCA1 gene.  Plan is q67mobreast ca screening with clinical exam and q670moRE and PSA.   Family history of prostate cancer    GERD (gastroesophageal reflux disease)    H. pylori infection 01/30/2012   Dx'd by serology   H/O peritonsillar abscess drainage 05/2013   episode on left 03/2012--no drainage required.  Right sided abscess required I&D.   History of tinnitus 03/2011   History of vitamin D deficiency 11/2009   15.3 (normal 32-100)   Hyperlipidemia  Dx'd approx 2006   Simvastatin and atorva failed/not covered by insurance.  Crestor tolerated ok for a while then pt had to stop ? side effects.  Atorva--intolerant.  Prava--tolerating as of 04/2018.   Left knee injury 11/2010   tibia fracture and knee sprain--[place]   Lumbar spondylosis 2017   w/hx of lumbar radiculopathy sx's (Dr. ZaCharlann Boxernd Dr. PoTrenton Gammon  Metabolic syndrome 208413 low HDL, high trigs, abdominal obesity   Migraine headache    Obesity, Class I, BMI 30-34.9    OSA (obstructive sleep apnea) 2017   Moderate 2017; intol CPAP.  Not candidate for inspire device due to getting repeated MRIs prostate.  [facility] continuing w/u as of 01/06/2019 f/u.   Paresthesias 09/2016   All 4 extremities.  Dr. YaKrista Bluetarted cymbalta 09/2016; some improvement noted so she increased cymbalta to 6058mid.  Pt c/o confusion episode/memory loss, so neuro has ordered MRI brain, which was normal.   Peripheral neuropathy    Prediabetes 2017   A1c 6.0% June 2019   [facility]  [place] [place]  [place] Chronic [place]    Past Surgical History:  Procedure Laterality Date   APPENDECTOMY  1997   Aspiration of ganglion cyst of right fibular head  summer  2015   Dr. Charlann Boxer   BICEPS TENDON REPAIR  2008   [place]   CARDIOVASCULAR STRESS TEST  07/2014   Nuclear stress test (for clearance for uvulectomy surgery): No ischemia, normal bp response, EF and LV wall motion normal.   COLONOSCOPY  11/18/2015   tubular adenoma, repeat 3 years per path report   INCISION AND DRAINAGE OF PERITONSILLAR ABCESS Right 05/16/2013   Procedure: INCISION AND DRAINAGE OF PERITONSILLAR ABCESS;  Surgeon: Melissa Montane, MD;  Location: [facility];  Service: ENT;  Laterality: Right.  Right tonsil bx: benign   TONSILLECTOMY  02/2014   Dr. Janace Hoard    Social History   Socioeconomic History   Marital status:  Married    Spouse name: Not on file   Number of children: 1   Years of education: College   Highest education level: Not on file  Occupational History   Occupation: Forensic scientist strain: Not on file   Food insecurity:    Worry: Not on file    Inability: Not on file   Transportation needs:    Medical: Not on file    Non-medical: Not on file  Tobacco Use   Smoking status: Never Smoker   Smokeless tobacco: Never Used  Substance and Sexual Activity   Alcohol use: Yes    Comment: occasional use   Drug use: No   Sexual activity: Not on file  Lifestyle   Physical activity:    Days per week: Not on file    Minutes per session: Not on file   Stress: Not on file  Relationships   Social connections:    Talks on phone: Not on file    Gets together: Not on file    Attends religious service: Not on file    Active member of club or organization: Not on file    Attends meetings of clubs or organizations: Not on file    Relationship status: Not on file  Other Topics Concern   Not on file  Social History Narrative   Married, 1 daughter.   Orig from [place] area, recently relocated back to the area (2012) after living in [place], [place] for 4 yrs.   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  Occasional alcohol.    Has two brothers without any known medical issues.   [employer] x 9 yrs; served 3 years in [place] during [place] (two purple hearts, right biceps injury, back injury--goes to [place] in [place]).   Right-handed.   2-3 cups per day.     FAMILY HISTORY:  We obtained a detailed, 4-generation family history.  Significant diagnoses are listed below: Family History  Problem Relation Age of Onset   Cancer Mother        breast ca (dx'd age 15).Died 2012 of Hodgkin's dz.   Hyperlipidemia Father    Breast cancer Maternal Aunt 42   Prostate cancer Maternal Uncle 66   Bone cancer Maternal  Grandmother        d. 14   Breast cancer Maternal Aunt 40   Breast cancer Maternal Aunt 42   Lung cancer Maternal Uncle    Healthy Daughter     The patient has one daughter who is cancer free.  He has two brothers who are older and are cancer free.  His father is living and his mother is deceased.  The patients mother was diagnosed with breast  cancer twice, the first time at age 44, and then with Hodgkin's lymphoma at 47.  She had three brothers and three sisters.  All of her sister had breast cancer between the ages of 49-42, one brother died in infancy, a second brother has prostate cancer and also has a son with prostate cancer, and the third brother died of lung cancer.  The maternal grandparents are deceased.  The grandmother died of bone cancer in her spine at 31.  The patient's father has two brothers and a sister.  None reportedly had cancer.  The paternal grandparents died of unknown causes.  Mr. Marzella is unaware of previous family history of genetic testing for hereditary cancer risks. Patient's maternal ancestors are of Native American ([place]) descent, and paternal ancestors are of [place] descent. There is no reported Ashkenazi Jewish ancestry. There is no known consanguinity.   GENETIC TESTING:  Mr. Gatz underwent genetic testing in August 2019 through his primary care office.  The genetic testing reported on August 2019 through the [facility] offered by [employer] identified a single, heterozygous pathogenic gene mutation called BRCA1, c.[id number]>T.     Clinical condition The average womans lifetime risk of developing breast cancer is 12%; her risk for developing ovarian cancer is 1.3% (SEER database 2014. https://seer.ShavedPoints.is. Accessed September 2017). Most cases of these cancers are sporadic and are not due to hereditary factors, but approximately 5-10% of breast and ovarian cancer cases are hereditary and due to an identifiable pathogenic  variant in a disease-causing gene. Hereditary breast and ovarian cancer syndrome (HBOC) due to pathogenic variants in the BRCA1 and BRCA2 genes accounts for the majority of hereditary breast and ovarian cancer cases in individuals with a strong family history or an early-onset diagnosis.  HBOC syndrome is characterized by an increased lifetime risk for generally adult-onset cancers including breast, contralateral breast, male breast, ovarian, prostate, and pancreatic (PMID: 99833825).  The cancers associated with BRCA1 are:   Breast cancer, up to a 87% risk (PMID: 0539767, 34193790)  Ovarian cancer, up to a 54% risk (PMID: 2409735, 32992426)  Pancreatic cancer, 1-3% (PMID: 83419622, 29798921, 19417408, 14481856)  Prostate cancer, elevated (31497026, 37858850)  BRCA1 also has preliminary evidence for an association with melanoma (PMID: 27741287) and hematologic malignancy (OMVE:72094709). Therefore, this gene is available as a preliminary-evidence gene on the [employer] Melanoma and Myelodysplastic Syndrome/Leukemia Panel. Preliminary-evidence genes are selected from an extensive review of the literature and expert recommendations, but the association between the gene and the specific condition has not been completely established. This uncertainty may be resolved as new information becomes available, and therefore clinicians may continue to order these preliminary-evidence genes.  Inheritance Hereditary predisposition to cancer due to pathogenic variants in the BRCA1 gene has autosomal dominant inheritance. This means that an individual with a pathogenic variant has a 50% chance of passing the condition on to his/her offspring. Most cases are inherited from a parent, but some cases may occur spontaneously (i.e., an individual with a pathogenic variant has parents who do not have it). Identification of a pathogenic variant allows for the recognition of at-risk relatives who can pursue testing for  the familial variant.  Management Management Guidelines for individuals with pathogenic BRCA1 variants have been developed by the [place] (NCCN):  NCCN cancer surveillance:  Females:   Breast awareness starting at age 28  Clinical breast exams every 6-12 months beginning at 55 years of age or at the age of the earliest diagnosed breast cancer in  the family, if below age 22  Annual breast MRIs with contrast beginning between the ages of 44 and 27 (or annual mammograms with consideration of tomosynthesis if MRI is unavailable), although the age to initiate screening may be individualized based on family history  Annual breast MRI with contrast and annual mammography with consideration of tomosynthesis between the ages of 9 and 52  After age 29, management should be considered on an individual basis.  For women treated for breast cancer, screening of remaining breast tissue with annual mammography and breast MRI should continue.  Consider risk-reducing mastectomy and counsel regarding degree of protection, degree of cancer risk, and reconstruction options.  Address the psychosocial, social, and quality-of-life aspects of undergoing risk-reducing mastectomy.  Recommend risk-reducing salpingo-oophorectomy, typically between age 55 and 75 years and upon the completion of childbearing. See specific Risk-Reducing Salpingo-Oophorectomy (RRSO) Protocol in NCCN Guidelines for Ovarian Cancer - Principles of Surgery.  Counseling includes a discussion of reproductive desires, extent of cancer risk, degree of protection for breast and ovarian cancer, management of menopausal symptoms, possible short-term hormonal replacement and medical issues.  Salpingectomy alone is not the standard of care for risk reduction, although clinical trials of interval salpingectomy and delayed oophorectomy are ongoing. The concern for risk reducing salpingectomy alone is that women are  still at risk for developing ovarian cancer. In addition, in pre-menopausal women, oophorectomy likely reduces the risk of developing breast cancer, but the magnitude is uncertain and may be gene specific.  For those patients who have not elected RRSO, transvaginal ultrasound combined with serum CA-125 for ovarian cancer screening has not been shown to be sufficient sensitive or specific as to support a positive recommendation, but, although of uncertain benefit, it may be considered at the clinicians discretion starting at age 24-35 years.  Consider risk-reducing agents as options for breast and ovarian cancer.  Males:   Breast self-exam training and education starting at age 27 years  Clinical breast exam every 12 months, beginning at age 67 years  Consider prostate-cancer screening beginning at age 21 years.  Pancreatic cancer: NCCN cites insufficient evidence to warrant screening for pancreatic cancer (NCCN. Genetic/Familial High-Risk Assessment: Breast and Ovarian. Version 1.2018). In contrast, the [employer] of Gastroenterology Clinical Guidelines recommend pancreatic cancer screening in BRCA1 carriers be limited to those with a first- or second-degree relative affected with pancreatic cancer. Ideally, screening should be performed in experienced centers utilizing a multidisciplinary approach under research conditions. Recommended screening includes annual endoscopic ultrasound and/or MRI of the pancreas starting at age 35 or 25 years younger than the earliest age of pancreatic cancer diagnosis in the family (PMID: 70623762).  Additional risk reduction and management considerations:   Chemoprevention can reduce the risk of breast cancer in the contralateral breast in women with BRCA1 and BRCA2 mutations who have been diagnosed with breast cancer (PMID: 8315176, 16073710).  Oral contraceptive use has been shown to reduce the risk of ovarian cancer by approximately 60% in BRCA  mutation carriers if taken for at least 5 years (PMID: 6269485).  Recent studies have some preliminary data that suggest PARP inhibitors may be a beneficial chemotherapeutic agent for a subset of patients with BRCA-associated breast and ovarian cancers. Clinical trials are currently in process to determine if and how these agents can be useful in the treatment of BRCA cancer patients (PMID: 46270350).  An individuals cancer risk and medical management are not determined by genetic test results alone. Overall cancer risk assessment incorporates additional factors including personal  medical history, family history, as well as available genetic information that may result in a personalized plan for cancer prevention and surveillance.  FAMILY MEMBERS: It is important that all of Mr. Orosz relatives (both men and women) know of the presence of this gene mutation. Site-specific genetic testing can sort out who in the family is at risk and who is not.   Mr. Wendorf daughter has a 50% chance to have inherited this mutation. However, she is relatively young and this will not be of any consequence to her for several years. We do not test children because there is no risk to them until they are adults. We recommend she have genetic counseling and testing by the time she is in her early 20's.    Mr. Shartzer brothers have a 50% chance to have inherited this mutation. We recommend they have genetic testing for this same mutation, as identifying the presence of this mutation would allow them to also take advantage of risk-reducing measures.   It is advantageous to know if a BRCA1 pathogenic variant is present as medical management recommendations can be implemented. At-risk relatives can be identified, allowing pursuit of a diagnostic evaluation. In addition, the available information regarding hereditary cancer susceptibility genes is constantly evolving and more clinically relevant BRCA1 data is likely to become  available in the near future. Awareness of this cancer predisposition allows patients and their providers to be vigilant in maintaining close and regular contact with their local genetics clinic in anticipation of new information, inform at-risk family members, and diligently follow condition-specific screening protocols.  SUPPORT AND RESOURCES: If Mr. Aman is interested in BRCA-specific information and support, there are two groups, Facing Our Risk (www.facingourrisk.com) and Bright Pink (www.brightpink.org) which some people have found useful. They provide opportunities to speak with other individuals from high-risk families. To locate genetic counselors in other cities, visit the website of the [employer] (ArtistMovie.se) and Secretary/administrator for a Social worker by zip code.  We encouraged Mr. Figuero to remain in contact with [place] on an annual basis so we can update his personal and family histories, and let him know of advances in cancer genetics that may benefit the family. Our contact number was provided. Mr. Sunga questions were answered to his satisfaction today, and he knows he is welcome to call anytime with additional questions.   Ki Corbo P. Florene Glen, [place], [facility] Certified Genetic Counselor [place] [email or web address] phone: [phone]  The patient was seen for a total of 54 minutes in face-to-face WebEx genetic counseling.  This patient was discussed with Drs. Magrinat, Lindi Adie and/or Burr Medico who agrees with the above.    _______________________________________________________________________ For Office Staff:  Number of people involved in session: 1 Was an Intern/ student involved with case: no

## 2019-02-02 NOTE — Progress Notes (Signed)
GU Location of Tumor / Histology: prostatic adenocarcinoma  If Prostate Cancer, Gleason Score is (4 + 3) and PSA is (2.99). Prostate volume: 36 cc  Jacob James reports they have been monitoring his PSA since 2017. Reports at one time his PSA was above 4. Reports he understands he is [place] +.  Biopsies of prostate (if applicable) revealed:    Past/Anticipated interventions by urology, if any: prostate biopsy, bone scan (uptake noted neck, rib, ankle, thoracic spine), referral for consideration of radiotherapy  Past/Anticipated interventions by medical oncology, if any: no  Weight changes, if any: no  Bowel/Bladder complaints, if any: IPSS 0. SHIM 1. Denies dysuria or hematuria. Reports occasional post void dribble.    Nausea/Vomiting, if any: no  Pain issues, if any:  Chronic back pain that radiates down both legs.  SAFETY ISSUES:  Prior radiation? No  Pacemaker/ICD? No  Possible current pregnancy? no, male patient  Is the patient on methotrexate? No  Current Complaints / other details:  53 year old male. Married with one daughter.   Cousin: prostate cancer. Father: prostate cancer. Mother and maternal aunt: breast ca. States, "my entire maternal side of the family have cancer." Reports his mother and all her sisters were diagnosed with breast cancer before the age of 45.

## 2019-02-03 ENCOUNTER — Encounter: Payer: Self-pay | Admitting: Radiation Oncology

## 2019-02-03 ENCOUNTER — Ambulatory Visit
Admission: RE | Admit: 2019-02-03 | Discharge: 2019-02-03 | Disposition: A | Discharge status: Home / Self Care | Payer: Self-pay | Source: Ambulatory Visit | Attending: Radiation Oncology | Admitting: Radiation Oncology

## 2019-02-03 ENCOUNTER — Other Ambulatory Visit: Payer: Self-pay

## 2019-02-03 ENCOUNTER — Other Ambulatory Visit: Payer: Self-pay | Admitting: Radiation Oncology

## 2019-02-03 ENCOUNTER — Ambulatory Visit
Admission: RE | Admit: 2019-02-03 | Discharge: 2019-02-03 | Disposition: A | Discharge status: Home / Self Care | Payer: Federal, State, Local not specified - PPO | Source: Ambulatory Visit | Attending: Radiation Oncology | Admitting: Radiation Oncology

## 2019-02-03 VITALS — Ht 74.0 in | Wt 230.0 lb

## 2019-02-03 DIAGNOSIS — C61 Malignant neoplasm of prostate: Secondary | ICD-10-CM

## 2019-02-03 DIAGNOSIS — Z0289 Encounter for other administrative examinations: Secondary | ICD-10-CM | POA: Insufficient documentation

## 2019-02-03 DIAGNOSIS — Z803 Family history of malignant neoplasm of breast: Secondary | ICD-10-CM | POA: Diagnosis not present

## 2019-02-03 DIAGNOSIS — Z8042 Family history of malignant neoplasm of prostate: Secondary | ICD-10-CM | POA: Diagnosis not present

## 2019-02-03 DIAGNOSIS — R972 Elevated prostate specific antigen [PSA]: Secondary | ICD-10-CM | POA: Diagnosis not present
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
## 2019-02-03 NOTE — Progress Notes (Signed)
See progress note under physician encounter. 

## 2019-02-05 ENCOUNTER — Encounter: Payer: Self-pay | Admitting: Family Medicine

## 2019-02-05 ENCOUNTER — Telehealth: Payer: Self-pay | Admitting: Medical Oncology

## 2019-02-05 ENCOUNTER — Encounter: Payer: Self-pay | Admitting: Medical Oncology

## 2019-02-05 NOTE — Telephone Encounter (Signed)
Called patient to inform him of appointment for CT abd/plevis on 4/20 @8 :30 am at [facility]. He confirmed appointment.

## 2019-02-05 NOTE — Progress Notes (Signed)
Spoke with Jacob James to introduce myself as the prostate nurse navigator and my role. I was unable to meet him due to his consults being done by telephone due to the COVID-19 restrictions. He states his visit went very well with Dr. Tammi Klippel. I informed him that I will call the [facility] to follow up on appointment for CT abd/pelvis. I gave him my contact information and asked him to leave me a voicemail and I will return his call. He voiced understanding and I will call him with update on CT.

## 2019-02-05 NOTE — Telephone Encounter (Signed)
Called Dr. Greer Pickerel[facility] to inquire about CT abd/pelvis that was ordered 01/09/19 but has not been scheduled. CT was scheduled for 4/20 at 8:30 am and I will notify patient.

## 2019-02-10 DIAGNOSIS — N62 Hypertrophy of breast: Secondary | ICD-10-CM | POA: Diagnosis not present

## 2019-02-10 DIAGNOSIS — Z8481 Family history of carrier of genetic disease: Secondary | ICD-10-CM | POA: Diagnosis not present

## 2019-02-10 DIAGNOSIS — Z803 Family history of malignant neoplasm of breast: Secondary | ICD-10-CM | POA: Diagnosis not present

## 2019-02-16 ENCOUNTER — Telehealth: Payer: Self-pay | Admitting: *Deleted

## 2019-02-16 NOTE — Telephone Encounter (Signed)
Medical records faxed to [facility]. [facility]; [place] [phone]

## 2019-02-23 DIAGNOSIS — C61 Malignant neoplasm of prostate: Secondary | ICD-10-CM | POA: Diagnosis not present

## 2019-02-26 ENCOUNTER — Encounter: Payer: Self-pay | Admitting: Medical Oncology

## 2019-02-27 ENCOUNTER — Encounter: Payer: Self-pay | Admitting: Medical Oncology

## 2019-03-02 ENCOUNTER — Encounter: Payer: Self-pay | Admitting: *Deleted

## 2019-03-02 DIAGNOSIS — H04123 Dry eye syndrome of bilateral lacrimal glands: Secondary | ICD-10-CM | POA: Diagnosis not present

## 2019-03-06 ENCOUNTER — Telehealth: Payer: Self-pay | Admitting: Medical Oncology

## 2019-03-06 NOTE — Progress Notes (Signed)
Call to [facility] in [place] for fax number and to request results of CT abd/pelvis 02/23/19. Request faxed.

## 2019-03-06 NOTE — Telephone Encounter (Signed)
Spoke with patient to let him know his CT was negative for mets. I informed him that I sent Dr. Rosana Hoes a copy. He states he called and spoke with Dr. Shann Medal nurse about surgery but he has not heard back with an appointment. I wished him well and asked him to call me if I can assist him in any way. He thanked me for following up with him.

## 2019-03-06 NOTE — Progress Notes (Signed)
Spoke with patient to follow up on CT. He did have the CT but has not heard from the results. He is having a hard time making his decision about treatment. We discussed the robotic prostatectomy and brachytherapy in detail. He feels due to his young age and his strong family history he should really have surgery. If his CT is negative and is going to call Dr. Rosana Hoes to further discuss surgery. I asked him to call me if he just needs to talk or has questions or concerns. He voiced understanding.

## 2019-03-23 ENCOUNTER — Encounter: Payer: Self-pay | Admitting: Family Medicine

## 2019-03-27 DIAGNOSIS — Z8546 Personal history of malignant neoplasm of prostate: Secondary | ICD-10-CM | POA: Diagnosis not present

## 2019-03-27 DIAGNOSIS — Z1159 Encounter for screening for other viral diseases: Secondary | ICD-10-CM | POA: Diagnosis not present

## 2019-04-02 DIAGNOSIS — G8929 Other chronic pain: Secondary | ICD-10-CM | POA: Diagnosis not present

## 2019-04-03 DIAGNOSIS — F419 Anxiety disorder, unspecified: Secondary | ICD-10-CM | POA: Diagnosis not present

## 2019-04-03 DIAGNOSIS — M545 Low back pain: Secondary | ICD-10-CM | POA: Diagnosis not present

## 2019-04-03 DIAGNOSIS — M5410 Radiculopathy, site unspecified: Secondary | ICD-10-CM | POA: Diagnosis not present

## 2019-04-03 DIAGNOSIS — F4310 Post-traumatic stress disorder, unspecified: Secondary | ICD-10-CM | POA: Diagnosis not present

## 2019-04-03 DIAGNOSIS — M5416 Radiculopathy, lumbar region: Secondary | ICD-10-CM | POA: Diagnosis not present

## 2019-04-03 DIAGNOSIS — R202 Paresthesia of skin: Secondary | ICD-10-CM | POA: Diagnosis not present

## 2019-04-03 DIAGNOSIS — Z7982 Long term (current) use of aspirin: Secondary | ICD-10-CM | POA: Diagnosis not present

## 2019-04-03 DIAGNOSIS — Z791 Long term (current) use of non-steroidal anti-inflammatories (NSAID): Secondary | ICD-10-CM | POA: Diagnosis not present

## 2019-04-03 DIAGNOSIS — Z79899 Other long term (current) drug therapy: Secondary | ICD-10-CM | POA: Diagnosis not present

## 2019-04-03 MED ORDER — GENERIC EXTERNAL MEDICATION
Status: DC
Start: ? — End: 2019-04-03

## 2019-04-03 MED ORDER — SODIUM CHLORIDE 0.9 % IV SOLN
10.00 | INTRAVENOUS | Status: DC
Start: ? — End: 2019-04-03

## 2019-04-03 MED ORDER — DEXAMETHASONE SODIUM PHOSPHATE 10 MG/ML IJ SOLN
10.00 | INTRAMUSCULAR | Status: DC
Start: 2019-04-04 — End: 2019-04-03

## 2019-04-06 HISTORY — PX: OTHER SURGICAL HISTORY: SHX169

## 2019-04-20 DIAGNOSIS — Z1159 Encounter for screening for other viral diseases: Secondary | ICD-10-CM | POA: Diagnosis not present

## 2019-04-23 MED ORDER — OXYCODONE HCL 5 MG PO TABS
2.50 | ORAL_TABLET | ORAL | Status: DC
Start: ? — End: 2019-04-23

## 2019-04-23 MED ORDER — GENERIC EXTERNAL MEDICATION
5.00 | Status: DC
Start: ? — End: 2019-04-23

## 2019-04-23 MED ORDER — SERTRALINE HCL 100 MG PO TABS
100.00 | ORAL_TABLET | ORAL | Status: DC
Start: 2019-04-24 — End: 2019-04-23

## 2019-04-23 MED ORDER — SODIUM CHLORIDE 0.9 % IV SOLN
INTRAVENOUS | Status: DC
Start: ? — End: 2019-04-23

## 2019-04-23 MED ORDER — DOCUSATE SODIUM 100 MG PO CAPS
100.00 | ORAL_CAPSULE | ORAL | Status: DC
Start: 2019-04-23 — End: 2019-04-23

## 2019-04-23 MED ORDER — ONDANSETRON 4 MG PO TBDP
4.00 | ORAL_TABLET | ORAL | Status: DC
Start: ? — End: 2019-04-23

## 2019-04-23 MED ORDER — ACETAMINOPHEN 500 MG PO TABS
1000.00 | ORAL_TABLET | ORAL | Status: DC
Start: 2019-04-23 — End: 2019-04-23

## 2019-04-23 MED ORDER — Medication
5.00 | Status: DC
Start: ? — End: 2019-04-23

## 2019-04-23 MED ORDER — CLONAZEPAM 0.5 MG PO TABS
0.50 | ORAL_TABLET | ORAL | Status: DC
Start: 2019-04-24 — End: 2019-04-23

## 2019-04-23 MED ORDER — BELLADONNA ALKALOIDS-OPIUM 16.2-30 MG RE SUPP
1.00 | RECTAL | Status: DC
Start: ? — End: 2019-04-23

## 2019-04-23 MED ORDER — TIZANIDINE HCL 4 MG PO TABS
4.00 | ORAL_TABLET | ORAL | Status: DC
Start: ? — End: 2019-04-23

## 2019-04-23 MED ORDER — QUETIAPINE FUMARATE 100 MG PO TABS
100.00 | ORAL_TABLET | ORAL | Status: DC
Start: 2019-04-23 — End: 2019-04-23

## 2019-04-23 MED ORDER — OXYBUTYNIN CHLORIDE 5 MG PO TABS
5.00 | ORAL_TABLET | ORAL | Status: DC
Start: ? — End: 2019-04-23

## 2019-04-23 MED ORDER — ROSUVASTATIN CALCIUM 20 MG PO TABS
20.00 | ORAL_TABLET | ORAL | Status: DC
Start: 2019-04-24 — End: 2019-04-23

## 2019-04-23 MED ORDER — POLYETHYLENE GLYCOL 3350 17 G PO PACK
17.00 | PACK | ORAL | Status: DC
Start: 2019-04-24 — End: 2019-04-23

## 2019-05-07 DIAGNOSIS — E785 Hyperlipidemia, unspecified: Secondary | ICD-10-CM | POA: Diagnosis not present

## 2019-05-07 DIAGNOSIS — G629 Polyneuropathy, unspecified: Secondary | ICD-10-CM | POA: Diagnosis not present

## 2019-05-07 DIAGNOSIS — C61 Malignant neoplasm of prostate: Secondary | ICD-10-CM | POA: Diagnosis not present

## 2019-05-19 DIAGNOSIS — G8929 Other chronic pain: Secondary | ICD-10-CM | POA: Diagnosis not present

## 2019-06-03 DIAGNOSIS — Z9079 Acquired absence of other genital organ(s): Secondary | ICD-10-CM | POA: Diagnosis not present

## 2019-06-03 DIAGNOSIS — Z1389 Encounter for screening for other disorder: Secondary | ICD-10-CM | POA: Diagnosis not present

## 2019-06-03 DIAGNOSIS — C61 Malignant neoplasm of prostate: Secondary | ICD-10-CM | POA: Diagnosis not present

## 2019-06-03 DIAGNOSIS — Z1509 Genetic susceptibility to other malignant neoplasm: Secondary | ICD-10-CM | POA: Diagnosis not present

## 2019-06-03 DIAGNOSIS — Z13220 Encounter for screening for lipoid disorders: Secondary | ICD-10-CM | POA: Diagnosis not present

## 2019-06-04 ENCOUNTER — Telehealth: Payer: Self-pay | Admitting: Radiation Oncology

## 2019-06-04 NOTE — Telephone Encounter (Signed)
Received voicemail message from patient requesting return call. Noted patient was consulted by Dr. Tammi Klippel in March of this year but opted to have a prostatectomy. Phoned patient back to inquire further. Patient reports his PSA yesterday at the [place] was 0.01. Patient verbalized that he understood the [facility] was referring him back to Dr. Tammi Klippel because when they removed his prostate "the cancer was protruding through it." Explained to the patient no records had been received yet from the [place] in [place] but our staff would reach out to Dr. Rosana Hoes. Patient understands he will hear back from [facility] with a reconsult appointment.

## 2019-06-05 ENCOUNTER — Telehealth: Payer: Self-pay | Admitting: *Deleted

## 2019-06-05 NOTE — Telephone Encounter (Signed)
Contacted Jacob James from the [facility] to request current documents and scans. Contacted patient to let him know that I will be calling him back to schedule once we have documents from the [facility].

## 2019-06-12 ENCOUNTER — Telehealth: Payer: Self-pay | Admitting: Medical Oncology

## 2019-06-12 ENCOUNTER — Other Ambulatory Visit: Payer: Self-pay | Admitting: Family Medicine

## 2019-06-12 DIAGNOSIS — Z8719 Personal history of other diseases of the digestive system: Secondary | ICD-10-CM

## 2019-06-12 NOTE — Telephone Encounter (Signed)
Toniann Ket, RN with  [facility], called stating he  received a request from Dr. Johny Shears office for records from Dr. Rosana Hoes'. He states patient has not seen Dr. Rosana Hoes at the [facility].  I explained that patient had surgery at [facility] with Dr. Rosana Hoes but had a follow up at the [facility] with PSA 06/03/19.Records faxed to me and delivered to  [facility] in radiation oncology.

## 2019-06-15 ENCOUNTER — Encounter: Payer: Self-pay | Admitting: *Deleted

## 2019-06-19 ENCOUNTER — Encounter: Payer: Self-pay | Admitting: *Deleted

## 2019-06-30 ENCOUNTER — Ambulatory Visit
Admission: RE | Admit: 2019-06-30 | Discharge: 2019-06-30 | Disposition: A | Discharge status: Home / Self Care | Payer: Federal, State, Local not specified - PPO | Source: Ambulatory Visit | Attending: Radiation Oncology | Admitting: Radiation Oncology

## 2019-06-30 ENCOUNTER — Other Ambulatory Visit: Payer: Self-pay

## 2019-06-30 DIAGNOSIS — Z803 Family history of malignant neoplasm of breast: Secondary | ICD-10-CM | POA: Diagnosis not present

## 2019-06-30 DIAGNOSIS — Z8042 Family history of malignant neoplasm of prostate: Secondary | ICD-10-CM | POA: Diagnosis not present

## 2019-06-30 DIAGNOSIS — F4310 Post-traumatic stress disorder, unspecified: Secondary | ICD-10-CM | POA: Insufficient documentation

## 2019-06-30 DIAGNOSIS — Z9079 Acquired absence of other genital organ(s): Secondary | ICD-10-CM | POA: Diagnosis not present

## 2019-06-30 DIAGNOSIS — C61 Malignant neoplasm of prostate: Secondary | ICD-10-CM

## 2019-06-30 NOTE — Progress Notes (Signed)
Patient states he had prostectomy.
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
## 2019-06-30 NOTE — Patient Instructions (Signed)
Coronavirus (COVID-19) Are you at risk?  Are you at risk for the Coronavirus (COVID-19)?  To be considered HIGH RISK for Coronavirus (COVID-19), you have to meet the following criteria:  . Traveled to [place], [place], [place], [place] or [place]; or in the [place] to [place], [place], [place], or [place]; and have fever, cough, and shortness of breath within the last 2 weeks of travel OR . Been in close contact with a person diagnosed with COVID-19 within the last 2 weeks and have fever, cough, and shortness of breath . IF YOU DO NOT MEET THESE CRITERIA, YOU ARE CONSIDERED LOW RISK FOR COVID-19.  What to do if you are HIGH RISK for COVID-19?  . If you are having a medical emergency, call 911. . Seek medical care right away. Before you go to a doctor's office, urgent care or emergency department, call ahead and tell them about your recent travel, contact with someone diagnosed with COVID-19, and your symptoms. You should receive instructions from your physician's office regarding next steps of care.  . When you arrive at healthcare provider, tell the healthcare staff immediately you have returned from visiting [place], [place], [place], [place] or [place]; or traveled in the [place] to [place], [place], [place], or [place]; in the last two weeks or you have been in close contact with a person diagnosed with COVID-19 in the last 2 weeks.   . Tell the health care staff about your symptoms: fever, cough and shortness of breath. . After you have been seen by a medical provider, you will be either: o Tested for (COVID-19) and discharged home on quarantine except to seek medical care if symptoms worsen, and asked to  - Stay home and avoid contact with others until you get your results (4-5 days)  - Avoid travel on public transportation if possible (such as bus, train, or airplane) or o Sent to the Emergency Department by EMS for evaluation, COVID-19 testing, and possible  admission depending on your condition and test results.  What to do if you are LOW RISK for COVID-19?  Reduce your risk of any infection by using the same precautions used for avoiding the common cold or flu:  . Wash your hands often with soap and warm water for at least 20 seconds.  If soap and water are not readily available, use an alcohol-based hand sanitizer with at least 60% alcohol.  . If coughing or sneezing, cover your mouth and nose by coughing or sneezing into the elbow areas of your shirt or coat, into a tissue or into your sleeve (not your hands). . Avoid shaking hands with others and consider head nods or verbal greetings only. . Avoid touching your eyes, nose, or mouth with unwashed hands.  . Avoid close contact with people who are sick. . Avoid places or events with large numbers of people in one location, like concerts or sporting events. . Carefully consider travel plans you have or are making. . If you are planning any travel outside or inside the [place], visit the CDC's Travelers' Health webpage for the latest health notices. . If you have some symptoms but not all symptoms, continue to monitor at home and seek medical attention if your symptoms worsen. . If you are having a medical emergency, call 911.   ADDITIONAL HEALTHCARE OPTIONS FOR PATIENTS  [facility] / e-Visit: [email or web address]         [facility]: [phone]  [place]   [facility]: [phone]                   [facility]: [phone]   

## 2019-07-28 ENCOUNTER — Telehealth: Payer: Self-pay | Admitting: Radiation Oncology

## 2019-07-28 DIAGNOSIS — L989 Disorder of the skin and subcutaneous tissue, unspecified: Secondary | ICD-10-CM | POA: Diagnosis not present

## 2019-07-28 DIAGNOSIS — E785 Hyperlipidemia, unspecified: Secondary | ICD-10-CM | POA: Diagnosis not present

## 2019-07-28 DIAGNOSIS — Z8546 Personal history of malignant neoplasm of prostate: Secondary | ICD-10-CM | POA: Diagnosis not present

## 2019-07-28 DIAGNOSIS — B351 Tinea unguium: Secondary | ICD-10-CM | POA: Diagnosis not present

## 2019-07-28 NOTE — Telephone Encounter (Signed)
Received voicemail message from patient requesting to setup ULTRA SENSITIVE PSA testing here at [facility] since "the [place] doesn't do that." Phoned patient back to inquire further. No answer. Left voicemail message explaining I have received his message and will phone him back once I have a more clear understanding of his next steps.   I have clipped the conclusion of his consult note:  At the conclusion of our conversation, the patient would like to proceed with close PSA surveillance, using ultra-sensitive PSA measures to monitor for any signs of consistent PSA elevation which would indicate residual prostate cancer.  Should his PSA begin to rise, he would be interested in proceeding with adjuvant radiotherapy at that time.  We will share our discussion with his team at the [facility] and will look forward to following along in his progress.  We would be more than happy to continue to participate in his care should there be a role for adjuvant or salvage radiotherapy in the future.

## 2019-07-28 NOTE — Telephone Encounter (Signed)
Ultra-sensitive PSA is a test that can detect changes in the PSA down to 100th of a point and is available at most of the Urology practices. Unfortunately, we do not have that test here at the cancer center, so if the [facility] is not able to do it either, he is fine to have the standard PSA with his urologist at the [facility] and will just need to have it monitored closely. Ailene Ards

## 2019-07-29 ENCOUNTER — Telehealth: Payer: Self-pay | Admitting: Radiation Oncology

## 2019-07-29 NOTE — Telephone Encounter (Signed)
Phoned patient. Wife answered patient's phone.  I explained that unfortunately, we do not have that test here at the cancer center, so if the [facility] is not able to do it either, he is fine to have the standard PSA with his urologist at the [facility] and will just need to have it monitored closely per Ashlyn Bruning, PA-C. Patient's wife verbalized understanding and expressed appreciation for the return call.

## 2019-08-03 DIAGNOSIS — B009 Herpesviral infection, unspecified: Secondary | ICD-10-CM | POA: Diagnosis not present

## 2019-08-04 LAB — PSA: PSA: 0.01

## 2019-08-18 DIAGNOSIS — F411 Generalized anxiety disorder: Secondary | ICD-10-CM | POA: Diagnosis not present

## 2019-08-18 DIAGNOSIS — F331 Major depressive disorder, recurrent, moderate: Secondary | ICD-10-CM | POA: Diagnosis not present

## 2019-08-18 DIAGNOSIS — F4310 Post-traumatic stress disorder, unspecified: Secondary | ICD-10-CM | POA: Diagnosis not present

## 2019-08-20 DIAGNOSIS — M950 Acquired deformity of nose: Secondary | ICD-10-CM | POA: Diagnosis not present

## 2019-08-20 DIAGNOSIS — J342 Deviated nasal septum: Secondary | ICD-10-CM | POA: Diagnosis not present

## 2019-08-20 DIAGNOSIS — J3489 Other specified disorders of nose and nasal sinuses: Secondary | ICD-10-CM | POA: Diagnosis not present

## 2019-08-31 ENCOUNTER — Encounter: Payer: Self-pay | Admitting: Family Medicine

## 2019-09-02 DIAGNOSIS — C61 Malignant neoplasm of prostate: Secondary | ICD-10-CM | POA: Diagnosis not present

## 2019-09-02 DIAGNOSIS — Z01812 Encounter for preprocedural laboratory examination: Secondary | ICD-10-CM | POA: Diagnosis not present

## 2019-09-03 DIAGNOSIS — Z86010 Personal history of colonic polyps: Secondary | ICD-10-CM | POA: Diagnosis not present

## 2019-09-03 DIAGNOSIS — K5730 Diverticulosis of large intestine without perforation or abscess without bleeding: Secondary | ICD-10-CM | POA: Diagnosis not present

## 2019-09-03 DIAGNOSIS — K5140 Inflammatory polyps of colon without complications: Secondary | ICD-10-CM | POA: Diagnosis not present

## 2019-09-03 DIAGNOSIS — Z1211 Encounter for screening for malignant neoplasm of colon: Secondary | ICD-10-CM | POA: Diagnosis not present

## 2019-09-08 ENCOUNTER — Encounter: Payer: Self-pay | Admitting: Family Medicine

## 2019-09-10 DIAGNOSIS — M6282 Rhabdomyolysis: Secondary | ICD-10-CM | POA: Diagnosis not present

## 2019-09-14 DIAGNOSIS — M6282 Rhabdomyolysis: Secondary | ICD-10-CM | POA: Diagnosis not present

## 2019-09-21 ENCOUNTER — Encounter: Payer: Self-pay | Admitting: Family Medicine

## 2019-09-21 NOTE — Telephone Encounter (Signed)
Pt's last ov was 10/15/18. Please advise if okay for letter?

## 2019-09-21 NOTE — Telephone Encounter (Signed)
Letter printed.

## 2019-09-21 NOTE — Telephone Encounter (Signed)
Letter printed and on my desk. 

## 2019-09-25 DIAGNOSIS — M47816 Spondylosis without myelopathy or radiculopathy, lumbar region: Secondary | ICD-10-CM | POA: Diagnosis not present

## 2019-10-07 DIAGNOSIS — M47812 Spondylosis without myelopathy or radiculopathy, cervical region: Secondary | ICD-10-CM | POA: Diagnosis not present

## 2019-10-07 DIAGNOSIS — Z1501 Genetic susceptibility to malignant neoplasm of breast: Secondary | ICD-10-CM | POA: Diagnosis not present

## 2019-10-07 DIAGNOSIS — Z9079 Acquired absence of other genital organ(s): Secondary | ICD-10-CM | POA: Diagnosis not present

## 2019-10-07 DIAGNOSIS — C61 Malignant neoplasm of prostate: Secondary | ICD-10-CM | POA: Diagnosis not present

## 2019-12-08 ENCOUNTER — Other Ambulatory Visit: Payer: Self-pay | Admitting: Family Medicine

## 2019-12-08 DIAGNOSIS — Z8719 Personal history of other diseases of the digestive system: Secondary | ICD-10-CM
